# Patient Record
Sex: Female | Born: 1970
Health system: Southern US, Community
[De-identification: ages and names within clinical notes are randomized; demographics above are authoritative.]

## PROBLEM LIST (undated history)

## (undated) DIAGNOSIS — R7309 Other abnormal glucose: Secondary | ICD-10-CM

## (undated) DIAGNOSIS — E7800 Pure hypercholesterolemia, unspecified: Secondary | ICD-10-CM

## (undated) DIAGNOSIS — D649 Anemia, unspecified: Secondary | ICD-10-CM

## (undated) HISTORY — DX: Other abnormal glucose: R73.09

## (undated) HISTORY — PX: TONSILECTOMY, ADENOIDECTOMY, BILATERAL MYRINGOTOMY AND TUBES: SHX2538

## (undated) HISTORY — DX: Pure hypercholesterolemia, unspecified: E78.00

---

## 2015-04-12 ENCOUNTER — Encounter (HOSPITAL_COMMUNITY): Payer: Self-pay | Admitting: Emergency Medicine

## 2015-04-12 ENCOUNTER — Emergency Department (INDEPENDENT_AMBULATORY_CARE_PROVIDER_SITE_OTHER)
Admission: EM | Admit: 2015-04-12 | Discharge: 2015-04-12 | Disposition: A | Discharge status: Home / Self Care | Payer: Medicaid Other | Source: Home / Self Care | Attending: Emergency Medicine | Admitting: Emergency Medicine

## 2015-04-12 DIAGNOSIS — R600 Localized edema: Secondary | ICD-10-CM

## 2015-04-12 HISTORY — DX: Anemia, unspecified: D64.9

## 2015-04-12 MED ORDER — FUROSEMIDE 20 MG PO TABS: 20.0000 mg | ORAL_TABLET | Freq: Every day | ORAL | Status: DC

## 2015-04-12 NOTE — ED Notes (Signed)
Patient has been in [place] for 6 days.  Patient complains of back pain and bilateral leg swelling and pain.  Left lower leg more painful than right.  Patient reports history of the same for 4 months.

## 2015-04-12 NOTE — ED Notes (Signed)
Patient is being seen in the same treatment room as minor child.  Being seen by the same provider.  There is a language barrier, translater phones are currently in use, waiting for turn using  interpreter services.

## 2015-04-12 NOTE — ED Notes (Signed)
Patient care delay secondary to waiting for translater phone availability

## 2015-04-12 NOTE — Discharge Instructions (Signed)

## 2015-04-12 NOTE — ED Provider Notes (Signed)
CSN: [id number]     Arrival date & time 04/12/15  1332 History   First MD Initiated Contact with Patient 04/12/15 1458     Chief Complaint  Patient presents with  . Back Pain  . Leg Pain   (Consider location/radiation/quality/duration/timing/severity/associated sxs/prior Treatment) Patient is a 45 y.o. female presenting with back pain and leg pain. The history is provided by the patient. No language interpreter was used.  Back Pain Location:  Lumbar spine Quality:  Aching Radiates to:  Does not radiate Pain severity:  Moderate Pain is:  Same all the time Onset quality:  Gradual Duration:  1 week Timing:  Constant Progression:  Worsening Chronicity:  New Context: physical stress   Relieved by:  Nothing Worsened by:  Nothing tried Ineffective treatments:  None tried Associated symptoms: leg pain   Leg Pain Associated symptoms: back pain   Pt complains of pain in her low back from traveling.  Pt reports swelling in both lower lower legs.   Past Medical History  Diagnosis Date  . Anemia    Past Surgical History  Procedure Laterality Date  . Cesarean section     No family history on file. Social History  Substance Use Topics  . Smoking status: Never Smoker   . Smokeless tobacco: None  . Alcohol Use: No   OB History    No data available     Review of Systems  Musculoskeletal: Positive for back pain. Negative for joint swelling.  All other systems reviewed and are negative.   Allergies  Review of patient's allergies indicates no known allergies.  Home Medications   Prior to Admission medications   Medication Sig Start Date End Date Taking? Authorizing Provider  furosemide (LASIX) 20 MG tablet Take 1 tablet (20 mg total) by mouth daily. 04/12/15   Fransico Meadow, PA-C   Meds Ordered and Administered this Visit  Medications - No data to display  BP 119/78 mmHg  Pulse 78  Temp(Src) 97.5 F (36.4 C) (Oral)  SpO2 97% No data found.   Physical Exam   Constitutional: She is oriented to person, place, and time. She appears well-developed and well-nourished.  HENT:  Head: Normocephalic and atraumatic.  Eyes: Conjunctivae and EOM are normal. Pupils are equal, round, and reactive to light.  Neck: Normal range of motion.  Cardiovascular: Normal rate and normal heart sounds.   Pulmonary/Chest: Effort normal.  Abdominal: Soft. She exhibits no distension.  Musculoskeletal: She exhibits edema.  Neurological: She is alert and oriented to person, place, and time.  Psychiatric: She has a normal mood and affect.  Nursing note and vitals reviewed.   ED Course  Procedures (including critical care time)  Labs Review Labs Reviewed - No data to display  Imaging Review No results found.   Visual Acuity Review  Right Eye Distance:   Left Eye Distance:   Bilateral Distance:    Right Eye Near:   Left Eye Near:    Bilateral Near:         MDM  Pt ask about mammogram.   Pt given number for breast center   1. Bilateral edema of lower extremity    Meds ordered this encounter  Medications  . furosemide (LASIX) 20 MG tablet    Sig: Take 1 tablet (20 mg total) by mouth daily.    Dispense:  30 tablet    Refill:  2    Order Specific Question:  Supervising Provider    Answer:  Ihor Gully D [phone]  [place], PA-C 04/12/15 [phone]

## 2015-04-13 MED FILL — FUROSEMIDE 20 MG TABLET: 20 | 30 days supply | Qty: 30 | Fill #0

## 2015-06-28 ENCOUNTER — Other Ambulatory Visit: Payer: Self-pay

## 2015-07-11 ENCOUNTER — Other Ambulatory Visit: Payer: Self-pay

## 2015-07-11 ENCOUNTER — Other Ambulatory Visit: Payer: Self-pay | Admitting: Infectious Disease

## 2015-07-11 DIAGNOSIS — Z1231 Encounter for screening mammogram for malignant neoplasm of breast: Secondary | ICD-10-CM

## 2015-07-12 NOTE — Congregational Nurse Program (Signed)
Congregational Nurse Program Note  Date of Encounter: 07/11/2015  Past Medical History: Past Medical History  Diagnosis Date  . Anemia     Encounter Details:     CNP Questionnaire - 07/12/15 1426    Patient Demographics   Is this a new or existing patient? New   Patient is considered a/an Refugee   Race Asian   Patient Assistance   Location of Patient Assistance Not Applicable   Patient's financial/insurance status Medicaid   Uninsured Patient No   Patient referred to apply for the following financial assistance Not Applicable   Food insecurities addressed Not Applicable   Transportation assistance Yes   Type of Assistance Bus Pass Given   Assistance securing medications No   Educational health offerings Not Applicable   Encounter Details   Primary purpose of visit Education/Health Concerns   Was an Emergency Department visit averted? Not Applicable   Does patient have a medical provider? Yes   Patient referred to Follow up with established PCP;Area Agency   Was a mental health screening completed? (GAINS tool) No   Does patient have dental issues? No   Does patient have vision issues? No   Since previous encounter, have you referred patient for abnormal blood pressure that resulted in a new diagnosis or medication change? No   Since previous encounter, have you referred patient for abnormal blood glucose that resulted in a new diagnosis or medication change? No      Office visit at [employer] by this Arabic speaking, [place] born lady able to communicate with basic English conversation. Family including spouse and son recent arrivals attending English classes at [place].  Requesting assistance to schedule mammogram appointment. Referred to PCP by [facility] for evaluation of leg and back pain. History of "hiatal hernia and lumbar disc herniation" per GHD screening.  Also pointed out pain of left chest area beneath breast. Denies arm pain or SOB. Plan:  Appointments: [facility] Jul 26, 2015 at 2:30 pm.; [facility] July 13, 2015 at 1:30pm; [facility] for eye exam and  Eyeglasses. Jannetta Quint, RN/CN. [phone]

## 2015-07-26 ENCOUNTER — Ambulatory Visit
Admission: RE | Admit: 2015-07-26 | Discharge: 2015-07-26 | Disposition: A | Discharge status: Home / Self Care | Payer: Medicaid Other | Source: Ambulatory Visit | Attending: Infectious Disease | Admitting: Infectious Disease

## 2015-07-26 DIAGNOSIS — Z1231 Encounter for screening mammogram for malignant neoplasm of breast: Secondary | ICD-10-CM

## 2015-11-16 ENCOUNTER — Ambulatory Visit (INDEPENDENT_AMBULATORY_CARE_PROVIDER_SITE_OTHER): Payer: Medicaid Other | Admitting: Certified Nurse Midwife

## 2015-11-16 DIAGNOSIS — R399 Unspecified symptoms and signs involving the genitourinary system: Secondary | ICD-10-CM

## 2015-11-16 LAB — POCT URINALYSIS DIPSTICK
BILIRUBIN UA: NEGATIVE
Blood, UA: 250
Glucose, UA: NEGATIVE
KETONES UA: NEGATIVE
Leukocytes, UA: NEGATIVE
Nitrite, UA: NEGATIVE
PH UA: 5
PROTEIN UA: NEGATIVE
SPEC GRAV UA: 1.025
Urobilinogen, UA: NEGATIVE

## 2015-11-16 MED ORDER — NITROFURANTOIN MONOHYD MACRO 100 MG PO CAPS: 100.0000 mg | ORAL_CAPSULE | Freq: Two times a day (BID) | ORAL | 0 refills | Status: DC

## 2015-11-16 NOTE — Progress Notes (Signed)
Pt is in office today as a new patient/ problem.  Pt speaks Arabic- Language Resources was used in order to communicate with pt.  Pt complains of burning with urination and pain.  Pt states that she has seen blood in her urine.   Pt information was reviewed with RDenney, CNM. Pt made aware that her urine will be sent to lab for testing and Macrobid was sent to her pharmacy, per Denney,CNM.  Pt made aware that if anything changes once her results are back she will receive a phone call regarding labs.  Interpreter states that we may call her in order to communicate with pt via phone.   Interpreter number is [phone], name Bedor.  Pt was made aware that we will schedule her an appt for new pt/ annual so that we may have interpreter present for exam. Pt has been scheduled for 12/08/15 with R.Denney, CNM.    Pt was not seen by provider today, chart was reviewed for plan and orders.

## 2015-11-18 LAB — URINE CULTURE

## 2015-11-29 DIAGNOSIS — Z1384 Encounter for screening for dental disorders: Secondary | ICD-10-CM

## 2015-11-29 NOTE — Congregational Nurse Program (Signed)
Congregational Nurse Program Note  Date of Encounter: 11/29/2015  Past Medical History: Past Medical History:  Diagnosis Date  . Anemia     Encounter Details:     CNP Questionnaire - 11/29/15 1337      Patient Demographics   Is this a new or existing patient? Existing   Patient is considered a/an Refugee   Race Asian     Patient Assistance   Location of Patient Assistance Not Applicable   Patient's financial/insurance status Low Income;Medicaid;[place] Card/Care Connects   Uninsured Patient Yes   Interventions Not Applicable   Patient referred to apply for the following financial assistance Not Applicable   Food insecurities addressed Not Applicable   Transportation assistance No   Assistance securing medications No   Educational health offerings Navigating the healthcare system     Encounter Details   Primary purpose of visit Other   Was an Emergency Department visit averted? Not Applicable   Does patient have a medical provider? Yes   Patient referred to Other (comment)   Was a mental health screening completed? (GAINS tool) No   Does patient have dental issues? Yes   Was a dental referral made? Yes   Does patient have vision issues? No   Was a vision referral made? No   Does your patient have an abnormal blood pressure today? No   Since previous encounter, have you referred patient for abnormal blood pressure that resulted in a new diagnosis or medication change? No   Does your patient have an abnormal blood glucose today? No   Since previous encounter, have you referred patient for abnormal blood glucose that resulted in a new diagnosis or medication change? No   Was there a life-saving intervention made? No     Office visit for this lady speaking primarily Arabic, but communicated her need for assistance scheduling follow-up dental appointment for pain in tooth treated 5/17 with root canal. Tooth intact and enamel darkened.  Appointment scheduled with dental office  of Dr. Dayna Barker. [facility], 12/01/15 at 11:20 am. Appointment information provided to client. Jannetta Quint, RN/CN

## 2015-12-08 ENCOUNTER — Ambulatory Visit: Payer: Self-pay | Admitting: Obstetrics

## 2015-12-11 ENCOUNTER — Encounter: Payer: Self-pay | Admitting: Obstetrics & Gynecology

## 2015-12-11 ENCOUNTER — Ambulatory Visit (INDEPENDENT_AMBULATORY_CARE_PROVIDER_SITE_OTHER): Payer: Medicaid Other | Admitting: Obstetrics & Gynecology

## 2015-12-11 VITALS — BP 106/74 | HR 84 | Temp 97.8°F | Wt 167.0 lb

## 2015-12-11 DIAGNOSIS — R102 Pelvic and perineal pain: Secondary | ICD-10-CM | POA: Diagnosis not present

## 2015-12-11 MED ORDER — IBUPROFEN 600 MG PO TABS: 600.0000 mg | ORAL_TABLET | Freq: Four times a day (QID) | ORAL | 3 refills | Status: DC | PRN

## 2015-12-11 NOTE — Progress Notes (Signed)
History:  45 y.o. G3P0 here today for eval of pelvic pain.  Pt reports that her sx occur 2-3 times per months.  The pain is worse with walking up stairs.  She has taken a 'antispasmodic from [place]' to relieve the pain.  She reports monthly cycles that are heavy for 3 days and light for 3 days.  She reports that the pain gets worse with her cycles.   She was prev told in [place] that she needed a surgery to suspend her uterus.  She denies bulge in the vagina or pelvic pressure. She reports BM's every other day and denies pain worse prior to a BM.     She c/o vaginal itching.  She reports washing with a solution from [place] that she dilutes with water. She reports that this used externally only.  She denies douching.     The following portions of the patient's history were reviewed and updated as appropriate: allergies, current medications, past family history, past medical history, past social history, past surgical history and problem list.  Review of Systems:  Pertinent items are noted in HPI.  Objective:  Physical Exam Blood pressure 106/74, pulse 84, temperature 97.8 F (36.6 C), weight 167 lb (75.8 kg), last menstrual period 12/01/2015. Gen: NAD Abd: Soft, nontender and nondistended. Well healed lower abd transverse incision. Pelvic: Normal appearing external genitalia; normal appearing vaginal mucosa and cervix.  Normal discharge.  Small uterus, no other palpable masses, no uterine tenderness. Adnexal tenderness that is difficult to differentiate from the usual examination discomfort. No prolapse was noted of uterus or bladder.   Labs and Imaging No results found.  Assessment & Plan:  Pelvic pain female- unknown etiology.  Pt with h/o ov cyst  No prolapse was noted.  Discussed prolapse with pt.  Pelvic US- complete and TV Motrin 600mg  po with food prn pain  Rec Dove unscented soap for cleansing.    F/u in 3 months or sooner prn Sekou Zuckerman L. Harraway-Smith, M.D., Cherlynn June

## 2015-12-18 ENCOUNTER — Telehealth: Payer: Self-pay | Admitting: Obstetrics and Gynecology

## 2015-12-18 NOTE — Telephone Encounter (Signed)
Used [employer] [phone] to leave pt a message that we need to speak with her about appointments that we have scheduled with her.  Left our number [phone] for her to give [place] a call

## 2015-12-20 DIAGNOSIS — Z23 Encounter for immunization: Secondary | ICD-10-CM

## 2015-12-20 NOTE — Congregational Nurse Program (Signed)
Congregational Nurse Program Note  Date of Encounter: 12/20/2015  Past Medical History: Past Medical History:  Diagnosis Date  . Anemia   . Hypercholesteremia     Encounter Details:     CNP Questionnaire - 12/20/15 1100      Patient Demographics   Is this a new or existing patient? Existing   Patient is considered a/an Refugee   Race Asian     Patient Assistance   Location of Patient Assistance Not Applicable   Patient's financial/insurance status Low Income;Medicaid;[place] Card/Care Connects   Uninsured Patient Yes   Interventions Not Applicable   Patient referred to apply for the following financial assistance Not Applicable   Food insecurities addressed Not Applicable   Transportation assistance No   Assistance securing medications No   Educational health offerings Navigating the healthcare system     Encounter Details   Primary purpose of visit Other   Was an Emergency Department visit averted? Not Applicable   Does patient have a medical provider? Yes   Patient referred to Other (comment)   Was a mental health screening completed? (GAINS tool) No   Does patient have dental issues? Yes   Was a dental referral made? Yes   Does patient have vision issues? No   Was a vision referral made? No   Does your patient have an abnormal blood pressure today? No   Since previous encounter, have you referred patient for abnormal blood pressure that resulted in a new diagnosis or medication change? No   Does your patient have an abnormal blood glucose today? No   Since previous encounter, have you referred patient for abnormal blood glucose that resulted in a new diagnosis or medication change? No   Was there a life-saving intervention made? No     Office visit for this Arabic speaking lady requesting assistance in scheduling appointment for immunizations due at [facility] Department. Scheduled on 01/02/16 at 8:15 to update required vaccines. Agency information and  instructions provided to client regarding future scheduling and cancellation of appointments. Jannetta Quint, RN/CN

## 2015-12-22 ENCOUNTER — Ambulatory Visit (HOSPITAL_COMMUNITY)
Admission: RE | Admit: 2015-12-22 | Discharge: 2015-12-22 | Disposition: A | Discharge status: Home / Self Care | Payer: Medicaid Other | Source: Ambulatory Visit | Attending: Obstetrics & Gynecology | Admitting: Obstetrics & Gynecology

## 2015-12-22 DIAGNOSIS — R102 Pelvic and perineal pain: Secondary | ICD-10-CM | POA: Insufficient documentation

## 2015-12-22 DIAGNOSIS — R938 Abnormal findings on diagnostic imaging of other specified body structures: Secondary | ICD-10-CM | POA: Insufficient documentation

## 2015-12-27 ENCOUNTER — Telehealth: Payer: Self-pay | Admitting: *Deleted

## 2015-12-27 DIAGNOSIS — Z0120 Encounter for dental examination and cleaning without abnormal findings: Secondary | ICD-10-CM

## 2015-12-27 NOTE — Congregational Nurse Program (Signed)
Congregational Nurse Program Note  Date of Encounter: 12/27/2015  Past Medical History: Past Medical History:  Diagnosis Date  . Anemia   . Hypercholesteremia     Encounter Details:     CNP Questionnaire - 12/27/15 1100      Patient Demographics   Is this a new or existing patient? Existing   Patient is considered a/an Refugee   Race Asian     Patient Assistance   Location of Patient Assistance Not Applicable   Patient's financial/insurance status Low Income;Medicaid;[place] Card/Care Connects   Uninsured Patient Yes   Interventions Not Applicable   Patient referred to apply for the following financial assistance Not Applicable   Food insecurities addressed Not Applicable   Transportation assistance No   Assistance securing medications No   Educational health offerings Navigating the healthcare system     Encounter Details   Primary purpose of visit Other   Was an Emergency Department visit averted? Not Applicable   Does patient have a medical provider? Yes   Patient referred to Other (comment)   Was a mental health screening completed? (GAINS tool) No   Does patient have dental issues? Yes   Was a dental referral made? Yes   Does patient have vision issues? No   Was a vision referral made? No   Does your patient have an abnormal blood pressure today? No   Since previous encounter, have you referred patient for abnormal blood pressure that resulted in a new diagnosis or medication change? No   Does your patient have an abnormal blood glucose today? No   Since previous encounter, have you referred patient for abnormal blood glucose that resulted in a new diagnosis or medication change? No   Was there a life-saving intervention made? No                                                                                                             Office visit at [place] to request assistance in scheduling an           appointment to see dental surgeon after evaluation of dental  pain by general dentist. Arabic speaking lady speaking limited English through interview. Appointment scheduled for January 23, 2016 at 1:30; Dr. Mart Piggs , [address] [place]. Jannetta Quint, RN/CN.

## 2015-12-27 NOTE — Telephone Encounter (Signed)
Call to patient - tried to convey results of test- but patient does not understand [place]. She has an appointment 10/9 and she will keep that to review her results.

## 2015-12-27 NOTE — Telephone Encounter (Signed)
-----   Message from Tamara Drafts, Tamara Proctor sent at 12/25/2015  8:49 AM EDT ----- Please call pt. Her sono does not show any ov cysts.  It does show 2 VERY small fibroids that are probably NOT the cause of pts sx.  No immediate f/u needed.  F/u for annual as needed.  Thx, clh-S

## 2016-01-01 ENCOUNTER — Ambulatory Visit (INDEPENDENT_AMBULATORY_CARE_PROVIDER_SITE_OTHER): Payer: Medicaid Other | Admitting: Obstetrics and Gynecology

## 2016-01-01 DIAGNOSIS — D259 Leiomyoma of uterus, unspecified: Secondary | ICD-10-CM | POA: Diagnosis not present

## 2016-01-01 DIAGNOSIS — Z712 Person consulting for explanation of examination or test findings: Secondary | ICD-10-CM

## 2016-01-01 NOTE — Progress Notes (Signed)
   Subjective:    Patient ID: Tamara Proctor, female    DOB: 24-Apr-1970, 45 y.o.   MRN: [id number]  HPI 45 yo presenting today to discuss ultrasound results. Patient reports feeling well since her September visit. Se denies any occurrence of pelvic pain. She states her period came a week early but was over normal flow and length  Past Medical History:  Diagnosis Date  . Anemia   . Hypercholesteremia    Past Surgical History:  Procedure Laterality Date  . CESAREAN SECTION    . TONSILECTOMY, ADENOIDECTOMY, BILATERAL MYRINGOTOMY AND TUBES     Family History  Problem Relation Age of Onset  . Hypertension Mother   . Diabetes Mother   . Breast cancer Mother   . Hypertension Father   . Diabetes Father    Social History  Substance Use Topics  . Smoking status: Never Smoker  . Smokeless tobacco: Never Used  . Alcohol use No      Review of Systems See pertinent in HPI    Objective:   Physical Exam Last menstrual period 12/01/2015.  GENERAL: Well-developed, well-nourished female in no acute distress.  Neuro: Alert and oriented x 3  FINDINGS: Uterus  Measurements: 9.1 x 5.5 x 6.6 cm. There is a 1.6 x 1.3 x 1.4 cm submucosal fibroid within the posterior uterine body. There is an additional 1.1 x 0.9 x 1.0 cm submucosal fibroid of the anterior uterine body.  Endometrium  Thickness: 13 mm.  No focal abnormality visualized.  Right ovary  Measurements: 2.8 x 1.6 x 1.6 cm. Normal appearance/no adnexal mass.  Left ovary  Measurements: 3.6 x 1.9 x 2.8 cm. Normal appearance/no adnexal mass.  Other findings  Trace pelvic free fluid.  IMPRESSION: Suspect two submucosal fibroids within the mid aspect of the uterine body, anteriorly and posteriorly.  Normal appearing ovaries.   Electronically Signed   By: Lovey Newcomer M.D.   On: 12/22/2015 11:14      Assessment & Plan:  45 yo here for results - Ultrasound results reviewed with the patient -  Discussed that etiology of pain is unlikely of GYN origin and to follow up with PCP if pain returns - Informed patient that irregular period or skipping periods is normal in the perimenopausal phase - patient verbalized understanding and all questions were answered - RTC for annual exam or prn

## 2016-01-01 NOTE — Progress Notes (Signed)
Patient is in office to follow up on results.

## 2016-02-13 NOTE — Congregational Nurse Program (Signed)
Congregational Nurse Program Note  Date of Encounter: 02/13/2016  Past Medical History: Past Medical History:  Diagnosis Date  . Anemia   . Hypercholesteremia     Encounter Details:     CNP Questionnaire - 02/13/16 1443      Patient Demographics   Is this a new or existing patient? Existing   Patient is considered a/an Refugee   Race Asian     Patient Assistance   Location of Patient Assistance Not Applicable   Patient's financial/insurance status Medicaid   Uninsured Patient ([facility] Oncologist) No   Patient referred to apply for the following financial assistance Not Applicable   Food insecurities addressed Not Applicable   Transportation assistance No   Assistance securing medications No   Educational Programmer, multimedia the healthcare system     Encounter Details   Primary purpose of visit Acute Illness/Condition Visit   Was an Emergency Department visit averted? Not Applicable   Does patient have a medical provider? Yes   Patient referred to Other   Was a mental health screening completed? (GAINS tool) No   Does patient have dental issues? Yes   Was a dental referral made? Yes   Does patient have vision issues? No   Does your patient have an abnormal blood pressure today? No   Since previous encounter, have you referred patient for abnormal blood pressure that resulted in a new diagnosis or medication change? No   Does your patient have an abnormal blood glucose today? No   Since previous encounter, have you referred patient for abnormal blood glucose that resulted in a new diagnosis or medication change? No   Was there a life-saving intervention made? No    Office visit for client to request information and assistance in changing PCP and completion of pre-registration information for [facility] FP clinic for self and spouse. Also requesting assistance in referral for completion of immunization information for Green Card application and referral to dentist  for cavity and broken tooth. Plan: Instructed in completion of family's information for PCP appointment at [facility]. Appointment for [facility] due after anniversary date for entry in [place].; call after 01/18. Dental appointment with  Dr. Burnard Bunting on. 02/21/16 at 3:10 pm. Jannetta Quint, RN/CN

## 2016-03-01 ENCOUNTER — Encounter (HOSPITAL_COMMUNITY): Payer: Self-pay | Admitting: Emergency Medicine

## 2016-03-01 ENCOUNTER — Emergency Department (HOSPITAL_COMMUNITY)
Admission: EM | Admit: 2016-03-01 | Discharge: 2016-03-01 | Disposition: A | Discharge status: Home / Self Care | Payer: Medicaid Other | Attending: Emergency Medicine | Admitting: Emergency Medicine

## 2016-03-01 DIAGNOSIS — M79632 Pain in left forearm: Secondary | ICD-10-CM | POA: Diagnosis not present

## 2016-03-01 DIAGNOSIS — M79602 Pain in left arm: Secondary | ICD-10-CM

## 2016-03-01 DIAGNOSIS — M25522 Pain in left elbow: Secondary | ICD-10-CM | POA: Diagnosis present

## 2016-03-01 MED ORDER — IBUPROFEN 400 MG PO TABS: 600.0000 mg | ORAL_TABLET | Freq: Four times a day (QID) | ORAL | 0 refills | Status: DC | PRN

## 2016-03-01 NOTE — Discharge Instructions (Signed)
Please take anti-inflammatory medication (Advil, 400 mg) every 6 hours for the first 5 days.  You may decrease this to once a day if your pain improves after 5 days.   Please wear the splint and sling to immobilize your wrist and elbow.  You may take off the sling to sleep, but please keep the splint on at night as well.   Return to the ED if your symptoms worsen or you experience numbness of loss of function of your left hand.   If you do not have a primary care doctor, you can call [facility] [phone]) to make an appointment and establish care with a doctor there

## 2016-03-01 NOTE — ED Notes (Signed)
Paged to apply long forearm wrist splint with sling

## 2016-03-01 NOTE — ED Notes (Signed)
EDP at bedside  

## 2016-03-01 NOTE — ED Notes (Signed)
Khaled [id number] Interpretor Pt. From [place] and speaks arabic.  Left arm started hurting today and had an episode similar two days ago. Pt. Hx of sciatica for left leg. Pt. Denies any chest pain. Pt. Hx of herniated diaphragm and elevated cholesterol.

## 2016-03-01 NOTE — Progress Notes (Signed)
Orthopedic Tech Progress Note Patient Details:  Tamara Proctor March 10, 1971 [id number]  Ortho Devices Type of Ortho Device: Velcro wrist forearm splint Ortho Device/Splint Interventions: Application   [place] Pink 03/01/2016, 4:56 PM

## 2016-03-01 NOTE — ED Triage Notes (Signed)
Pt. Lt. Arm pain the pain begins by the elbow and radiates into her hand.   She denies any chest pain , sob , an/v/d  Movement increases the pain.  She is also having pain lt. Calf area , denies any injuries the pain radiates into the foot.   The pain in her calf started a while ago, but today the pain has increased.  Skin is warm and dry and pink.  She denies any cold symptoms..  Used the [employer] for Interrupter.

## 2016-03-01 NOTE — ED Provider Notes (Signed)
[facility] DEPT Provider Note   CSN: [id number] Arrival date & time: 03/01/16  1224     History   Chief Complaint Chief Complaint  Patient presents with  . Arm Pain    HPI Tamara Proctor is a 45 y.o. left handed female with pmh as noted below presents with L elbow pain, forearm pain and wrist pain a/w finger tingling x 2 days.  Pt says tingling is worst in L middle and ring finger.  Pt denies trauma or injury to R arm.  Pt is not currently working, does not play tennis or any other sports that require repetitive use of her L hand.  Pt has not tried any pain medications.    HPI  Past Medical History:  Diagnosis Date  . Anemia   . Hypercholesteremia     There are no active problems to display for this patient.   Past Surgical History:  Procedure Laterality Date  . CESAREAN SECTION    . TONSILECTOMY, ADENOIDECTOMY, BILATERAL MYRINGOTOMY AND TUBES      OB History    Gravida Para Term Preterm AB Living   3         3   SAB TAB Ectopic Multiple Live Births                   Home Medications    Prior to Admission medications   Medication Sig Start Date End Date Taking? Authorizing Provider  ibuprofen (ADVIL,MOTRIN) 400 MG tablet Take 1.5 tablets (600 mg total) by mouth every 6 (six) hours as needed. 03/01/16   Kinnie Feil, PA-C  ibuprofen (ADVIL,MOTRIN) 600 MG tablet Take 1 tablet (600 mg total) by mouth every 6 (six) hours as needed. Patient not taking: Reported on 01/01/2016 12/11/15   Lavonia Drafts, MD    Family History Family History  Problem Relation Age of Onset  . Hypertension Mother   . Diabetes Mother   . Breast cancer Mother   . Hypertension Father   . Diabetes Father     Social History Social History  Substance Use Topics  . Smoking status: Never Smoker  . Smokeless tobacco: Never Used  . Alcohol use No     Allergies   Patient has no known allergies.   Review of Systems Review of Systems  Constitutional: Negative  for fever.  Respiratory: Negative for cough and shortness of breath.   Cardiovascular: Negative for chest pain and leg swelling.  Gastrointestinal: Negative for abdominal pain, constipation, diarrhea, nausea and vomiting.  Genitourinary: Negative for difficulty urinating.  Musculoskeletal: Negative for back pain.  Skin: Negative for rash.  Neurological: Negative for headaches.  Hematological: Negative.   Psychiatric/Behavioral: Negative.      Physical Exam Updated Vital Signs BP 105/67   Pulse 65   Resp 16   Ht 5\' 2"  (1.575 m)   Wt 75.8 kg   LMP 02/12/2016   SpO2 100%   BMI 30.54 kg/m   Physical Exam  Constitutional: She is oriented to person, place, and time. She appears well-developed and well-nourished.  Pt found seated on bed accompanied by family member.  I used spectrum translator [email or web address] [id number] to communicate with patient.   HENT:  Head: Normocephalic and atraumatic.  Mouth/Throat: Oropharynx is clear and moist. No oropharyngeal exudate.  Eyes: Conjunctivae are normal. Pupils are equal, round, and reactive to light.  Neck: Neck supple.  Cardiovascular: Normal rate, regular rhythm and intact distal pulses.   No murmur heard. Radial pulses intact bilaterally.  Pulmonary/Chest: Effort normal and breath sounds normal.  Musculoskeletal: Normal range of motion. She exhibits no deformity.  Tenderness over medial elbow and medial forearm with deep pressure. Full ROM of wrists, fingers and elbows bilaterally. No erythema, edema, ecchymosis or signs of trauma over R elbow, forearm or wrist.    Lymphadenopathy:    She has no cervical adenopathy.  Neurological: She is alert and oriented to person, place, and time.  Positive Tinnel's and Phalen's on R wrist. Sensation to light touch intact in upper extremities.  Muscle tone and bulk normal of R forearm and hand.  Strength 5/5 in bilateral biceps, triceps, wrist extension/flexion, thumb opposition and finger abduction bilaterally.   5/5 grip strength bilaterally.    Skin: Skin is warm and dry. Capillary refill takes less than 2 seconds.  Psychiatric: She has a normal mood and affect.  Nursing note and vitals reviewed.    ED Treatments / Results  Labs (all labs ordered are listed, but only abnormal results are displayed) Labs Reviewed - No data to display  EKG  EKG Interpretation None       Radiology No results found.  Procedures Procedures (including critical care time)  Medications Ordered in ED Medications - No data to display   Initial Impression / Assessment and Plan / ED Course  I have reviewed the triage vital signs and the nursing notes.  Pertinent labs & imaging results that were available during my care of the patient were reviewed by me and considered in my medical decision making (see chart for details).  Clinical Course    45 yo female with pmh as noted above presents with pain at L medial elbow that radiates to forearm and wrist associated with L middle and ring finger tingling that started 2 days ago.  No injury.  Pt is left handed.  On exam pt has tenderness over L medial elbow and forearm.  Positive Tinnel's and Phalen's of R wrist.  Strength and sensation to light touch intact in affected L extremity.  L radial pulse intact. No midline cervical pain tenderness or pain over R shoulder.  Doubt fracture or dislocation.  Pt presentation and exam most consistent with carpal tunnel syndrome.  No further imaging or lab work up indicated at this time.  Pt stable for discharge with long L wrist splint and sling for additional support and ibuprofen, rest, ice, and close follow up with PCP.  Pt states she has a PCP, although one was not listed on pt's chart.  Pt given information for [facility].  Pt agreeable to plan, verbalized understanding.   Use of translator ([email or web address] [id number]) for patient communication.    Final Clinical Impressions(s) / ED Diagnoses   Final  diagnoses:  Left arm pain    New Prescriptions Discharge Medication List as of 03/01/2016  4:43 PM    START taking these medications   Details  !! ibuprofen (ADVIL,MOTRIN) 400 MG tablet Take 1.5 tablets (600 mg total) by mouth every 6 (six) hours as needed., Starting Fri 03/01/2016, Print     !! - Potential duplicate medications found. Please discuss with provider.       Kinnie Feil, PA-C 03/01/16 [place], MD 03/02/16 [place], MD 03/02/16 1256

## 2016-04-05 ENCOUNTER — Telehealth: Payer: Self-pay | Admitting: General Practice

## 2016-04-05 ENCOUNTER — Encounter: Payer: Self-pay | Admitting: Family Medicine

## 2016-04-05 ENCOUNTER — Other Ambulatory Visit: Payer: Self-pay | Admitting: Family Medicine

## 2016-04-05 ENCOUNTER — Ambulatory Visit (INDEPENDENT_AMBULATORY_CARE_PROVIDER_SITE_OTHER): Payer: BLUE CROSS/BLUE SHIELD | Admitting: Family Medicine

## 2016-04-05 VITALS — BP 110/70 | HR 78 | Temp 98.4°F | Ht 62.0 in | Wt 174.0 lb

## 2016-04-05 DIAGNOSIS — E785 Hyperlipidemia, unspecified: Secondary | ICD-10-CM | POA: Diagnosis not present

## 2016-04-05 DIAGNOSIS — M5432 Sciatica, left side: Secondary | ICD-10-CM | POA: Insufficient documentation

## 2016-04-05 DIAGNOSIS — D509 Iron deficiency anemia, unspecified: Secondary | ICD-10-CM

## 2016-04-05 DIAGNOSIS — M545 Low back pain: Secondary | ICD-10-CM

## 2016-04-05 LAB — COMPLETE METABOLIC PANEL WITH GFR
ALT: 9 U/L (ref 6–29)
AST: 15 U/L (ref 10–35)
Albumin: 4 g/dL (ref 3.6–5.1)
Alkaline Phosphatase: 53 U/L (ref 33–115)
BILIRUBIN TOTAL: 0.2 mg/dL (ref 0.2–1.2)
BUN: 13 mg/dL (ref 7–25)
CALCIUM: 9.1 mg/dL (ref 8.6–10.2)
CO2: 27 mmol/L (ref 20–31)
CREATININE: 0.79 mg/dL (ref 0.50–1.10)
Chloride: 105 mmol/L (ref 98–110)
GFR, Est Non African American: 89 mL/min (ref >=60)
Glucose, Bld: 72 mg/dL (ref 65–99)
Potassium: 4.2 mmol/L (ref 3.5–5.3)
Sodium: 138 mmol/L (ref 135–146)
Total Protein: 6.8 g/dL (ref 6.1–8.1)

## 2016-04-05 LAB — CBC WITH DIFFERENTIAL/PLATELET
BASOS PCT: 0 %
Basophils Absolute: 0 cells/uL (ref 0–200)
Eosinophils Absolute: 62 cells/uL (ref 15–500)
Eosinophils Relative: 1 %
HCT: 36.7 % (ref 35.0–45.0)
Hemoglobin: 11.9 g/dL (ref 11.7–15.5)
LYMPHS PCT: 28 %
Lymphs Abs: 1736 cells/uL (ref 850–3900)
MCH: 28.1 pg (ref 27.0–33.0)
MCHC: 32.4 g/dL (ref 32.0–36.0)
MCV: 86.6 fL (ref 80.0–100.0)
MONO ABS: 558 {cells}/uL (ref 200–950)
MONOS PCT: 9 %
MPV: 10.9 fL (ref 7.5–12.5)
Neutro Abs: 3844 cells/uL (ref 1500–7800)
Neutrophils Relative %: 62 %
PLATELETS: 245 10*3/uL (ref 140–400)
RBC: 4.24 MIL/uL (ref 3.80–5.10)
RDW: 13.5 % (ref 11.0–15.0)
WBC: 6.2 10*3/uL (ref 3.8–10.8)

## 2016-04-05 LAB — LIPID PANEL
CHOL/HDL RATIO: 4.9 ratio (ref <5.0)
Cholesterol: 221 mg/dL — ABNORMAL HIGH (ref <200)
HDL: 45 mg/dL — AB (ref >50)
LDL CALC: 138 mg/dL — AB (ref <100)
TRIGLYCERIDES: 188 mg/dL — AB (ref <150)
VLDL: 38 mg/dL — ABNORMAL HIGH (ref <30)

## 2016-04-05 LAB — TSH: TSH: 1.97 mIU/L

## 2016-04-05 MED ORDER — GABAPENTIN 100 MG PO CAPS: 100.0000 mg | ORAL_CAPSULE | Freq: Three times a day (TID) | ORAL | 3 refills | Status: DC

## 2016-04-05 NOTE — Assessment & Plan Note (Addendum)
  Has been told she has high cholesterol in past but was not put on any medication for this. Has eaten today  -lipid panel and CMP today -follow up on results at next appt

## 2016-04-05 NOTE — Assessment & Plan Note (Signed)
  Has been told she is anemic in past, possibly iron deficient but is unsure  -CBC w/ diff today -consider adding iron panel on in future if anemic -follow up in 2-3 weeks to discuss results

## 2016-04-05 NOTE — Telephone Encounter (Signed)
Pt changed her mind about where she wanted to get her MRI done.  She wants to go to [facility].  The appt has been cancelled and another appt made at [facility]. A new drs order is needed.  Please leave the original order in and let [facility] remove it after they transfer the answers to questions.

## 2016-04-05 NOTE — Addendum Note (Signed)
Addended by: Lucila Maine C on: 04/05/2016 04:15 PM   Modules accepted: Orders

## 2016-04-05 NOTE — Progress Notes (Signed)
    Subjective:    Patient ID: Tamara Proctor, female    DOB: March 31, 1970, 46 y.o.   MRN: [id number]   CC: new patient here to establish care, left sciatic pain, hand swelling  Left sciatic pain- has been going on for several years. Was seen in [place] and had imaging, was told she had an issue with a disc in her spine. She cannot recall what was wrong but maybe a slipped or bulging disc. Nothing was done. The pain occurs constantly and is worse with walking. She is frustrated by it. Was told to take ibuprofen for this but she had reflux and cannot tolerate NSAIDs well. No weakness, no bowel/bladder incontinence or retention. No loss of sensations.   Hand swelling- this has been an ongoing issue as well. Has been seen for this in past over past several years but never diagnosed with anything. Was told it was fluid overload and given Lasix to take. Has not taken lasix for this episode. Has been going on past few days. Feels they are painful at night occasionally. Denies other joint pain. No fevers or chills.   PMH- high cholesterol, sciatic pain on left, anemia, back pain, GERD, RA? Meds- omeprazole as needed, vitamin B12 Allg- seasonal SurgHx- c-section 72 FH- mom with DM, CAD, HTN. Dad with blood clot, CAD, HTN. Brother had stroke SH- married with 2 children, recently moved here from [place]. Stay at home mom.  Smoking status reviewed- non-smoker  Review of Systems- see HPI   Objective:  BP 110/70   Pulse 78   Temp 98.4 F (36.9 C) (Oral)   Ht 5\' 2"  (1.575 m)   Wt 174 lb (78.9 kg)   LMP 03/09/2016   BMI 31.83 kg/m  Vitals and nursing note reviewed  General: well nourished, in no acute distress Neck: supple, non-tender, without lymphadenopathy Cardiac: RRR, clear S1 and S2, no murmurs, rubs, or gallops Respiratory: clear to auscultation bilaterally, no increased work of breathing Abdomen: soft, nontender, nondistended, no masses or organomegaly. Bowel sounds  present Extremities: no edema or cyanosis. Warm, well perfused. 2+ radial pulses bilaterally Skin: warm and dry, no rashes noted, capillary refill <3 seconds Neuro: alert and oriented, no focal deficits. Gait normal. 5/5 strength in upper and lower extremities bilaterally. Sensation in tact throughout.    Assessment & Plan:    Sciatica of left side  Has been on and off problems for years. Has been told in past in [place] she has a disc slipped or bulging- she is unsure  -gabapentin 100mg  TID, titrate up to 200 then 300 TID as tolerated -lumbar MRI w/o contrast -will discuss further at follow up visit  Iron deficiency anemia  Has been told she is anemic in past, possibly iron deficient but is unsure  -CBC w/ diff today -consider adding iron panel on in future if anemic -follow up in 2-3 weeks to discuss results  Hyperlipidemia  Has been told she has high cholesterol in past but was not put on any medication for this. Has eaten today  -lipid panel and CMP today -follow up on results at next appt    Return in about 3 weeks (around 04/26/2016).   Lucila Maine, DO Family Medicine Resident PGY-1

## 2016-04-05 NOTE — Assessment & Plan Note (Signed)
  Has been on and off problems for years. Has been told in past in [place] she has a disc slipped or bulging- she is unsure  -gabapentin 100mg  TID, titrate up to 200 then 300 TID as tolerated -lumbar MRI w/o contrast -will discuss further at follow up visit

## 2016-04-05 NOTE — Telephone Encounter (Signed)
Will forward to Dr. Vanetta Shawl to place a new order for this MRI.  Jazmin Hartsell,CMA

## 2016-04-12 ENCOUNTER — Ambulatory Visit (HOSPITAL_COMMUNITY): Payer: BLUE CROSS/BLUE SHIELD

## 2016-04-12 ENCOUNTER — Encounter (HOSPITAL_COMMUNITY): Payer: Self-pay

## 2016-04-13 ENCOUNTER — Other Ambulatory Visit: Payer: BLUE CROSS/BLUE SHIELD

## 2016-04-15 ENCOUNTER — Ambulatory Visit
Admission: RE | Admit: 2016-04-15 | Discharge: 2016-04-15 | Disposition: A | Discharge status: Home / Self Care | Payer: BLUE CROSS/BLUE SHIELD | Source: Ambulatory Visit | Attending: Family Medicine | Admitting: Family Medicine

## 2016-04-15 ENCOUNTER — Other Ambulatory Visit: Payer: BLUE CROSS/BLUE SHIELD

## 2016-04-15 DIAGNOSIS — M5432 Sciatica, left side: Secondary | ICD-10-CM

## 2016-04-22 ENCOUNTER — Ambulatory Visit (INDEPENDENT_AMBULATORY_CARE_PROVIDER_SITE_OTHER): Payer: BLUE CROSS/BLUE SHIELD | Admitting: Family Medicine

## 2016-04-22 VITALS — BP 112/72 | HR 73 | Temp 98.5°F | Ht 62.0 in | Wt 173.0 lb

## 2016-04-22 DIAGNOSIS — E785 Hyperlipidemia, unspecified: Secondary | ICD-10-CM

## 2016-04-22 DIAGNOSIS — M5432 Sciatica, left side: Secondary | ICD-10-CM

## 2016-04-22 MED ORDER — GABAPENTIN 100 MG PO CAPS: 200.0000 mg | ORAL_CAPSULE | Freq: Three times a day (TID) | ORAL | 3 refills | Status: DC

## 2016-04-22 NOTE — Patient Instructions (Addendum)
  Please do the stretches I gave you for lower back pain.  Slowly increase Gabapentin to 3 pills 3 times a day.

## 2016-04-22 NOTE — Assessment & Plan Note (Addendum)
  Current ASCVD score 1.1%.  -encouraged lifestyle changes -will repeat lipid panel in 1 year

## 2016-04-22 NOTE — Assessment & Plan Note (Addendum)
  MRI shows disc bulging at L4-L5 with mild nerve root compression. Patient not interested in injections at this time  -Increase gabapentin to 200 mg TID, RX sent to pharmacy -can increase this further to 300 mg TID if drowsiness does not limit doing so -Patient given home stretches to do for low back pain -Follow up as needed

## 2016-04-22 NOTE — Progress Notes (Signed)
   Subjective:    Patient ID: Tamara Proctor, female    DOB: [birth year], 46 y.o.   MRN: [id number]   CC: back pain follow up on MRI results  Back pain- gave gabapentin at last visit- has been taking 100 mg three times a day and it helps. She does have drowsiness with this medicine. She reports having numbness in her left leg and she stumbled trying to walk on it last month. She will have numbness when she kneels to pray. She also has pain in the leg especially at night. She has had issues with bladder incontinence for over a year. She leaks urine with sneezing or coughing. She adamantly does not want any surgical intervention or injections in her back.   She also reports some left shoulder pain. She cannot tolerate ibuprofen, it hurts her stomach.  Smoking status reviewed- never smoker  Review of Systems- see HPI   Objective:  BP 112/72 (BP Location: Right Arm, Patient Position: Sitting, Cuff Size: Normal)   Pulse 73   Temp 98.5 F (36.9 C) (Oral)   Ht 5\' 2"  (1.575 m)   Wt 173 lb (78.5 kg)   SpO2 99%   BMI 31.64 kg/m  Vitals and nursing note reviewed  General: well nourished, in no acute distress HEENT: normocephalic, TM's visualized bilaterally, no scleral icterus or conjunctival pallor, no nasal discharge, moist mucous membranes, good dentition without erythema or discharge noted in posterior oropharynx Neck: supple, non-tender, without lymphadenopathy Cardiac: RRR, clear S1 and S2, no murmurs, rubs, or gallops Respiratory: clear to auscultation bilaterally, no increased work of breathing Abdomen: soft, nontender, nondistended, no masses or organomegaly. Bowel sounds present Extremities: no edema or cyanosis. Warm, well perfused. 2+ radial and PT pulses bilaterally Skin: warm and dry, no rashes noted Neuro: alert and oriented, no focal deficits   Assessment & Plan:    Hyperlipidemia  Current ASCVD score 1.1%.  -encouraged lifestyle changes -will repeat lipid  panel in 1 year  Sciatica of left side  MRI shows disc bulging at L4-L5 with mild nerve root compression. Patient not interested in injections at this time  -Increase gabapentin to 200 mg TID, RX sent to pharmacy -can increase this further to 300 mg TID if drowsiness does not limit doing so -Patient given home stretches to do for low back pain -Follow up as needed    Return in about 6 months (around 10/20/2016).   Lucila Maine, DO Family Medicine Resident PGY-1

## 2016-05-16 ENCOUNTER — Other Ambulatory Visit: Payer: Self-pay | Admitting: Family Medicine

## 2016-05-16 MED ORDER — AZITHROMYCIN 250 MG PO TABS: ORAL_TABLET | ORAL | 0 refills | Status: AC

## 2016-05-18 ENCOUNTER — Emergency Department (HOSPITAL_COMMUNITY)
Admission: EM | Admit: 2016-05-18 | Discharge: 2016-05-18 | Disposition: A | Discharge status: Home / Self Care | Payer: BLUE CROSS/BLUE SHIELD | Attending: Emergency Medicine | Admitting: Emergency Medicine

## 2016-05-18 ENCOUNTER — Encounter (HOSPITAL_COMMUNITY): Payer: Self-pay | Admitting: Emergency Medicine

## 2016-05-18 ENCOUNTER — Emergency Department (HOSPITAL_COMMUNITY): Payer: BLUE CROSS/BLUE SHIELD

## 2016-05-18 DIAGNOSIS — Z79899 Other long term (current) drug therapy: Secondary | ICD-10-CM | POA: Insufficient documentation

## 2016-05-18 DIAGNOSIS — R05 Cough: Secondary | ICD-10-CM | POA: Diagnosis present

## 2016-05-18 DIAGNOSIS — R059 Cough, unspecified: Secondary | ICD-10-CM

## 2016-05-18 MED ORDER — ALBUTEROL SULFATE HFA 108 (90 BASE) MCG/ACT IN AERS: 1.0000 | INHALATION_SPRAY | Freq: Four times a day (QID) | RESPIRATORY_TRACT | 0 refills | Status: DC | PRN

## 2016-05-18 MED ORDER — ACETAMINOPHEN 325 MG PO TABS: 650.0000 mg | ORAL_TABLET | Freq: Four times a day (QID) | ORAL | 0 refills | Status: AC | PRN

## 2016-05-18 MED ORDER — DEXAMETHASONE 4 MG PO TABS
8.0000 mg | ORAL_TABLET | Freq: Once | ORAL | Status: AC
  Administered 2016-05-18: 8 mg via ORAL
  Filled 2016-05-18: qty 2

## 2016-05-18 MED ORDER — ALBUTEROL SULFATE (2.5 MG/3ML) 0.083% IN NEBU
5.0000 mg | INHALATION_SOLUTION | Freq: Once | RESPIRATORY_TRACT | Status: AC
  Administered 2016-05-18: 5 mg via RESPIRATORY_TRACT
  Filled 2016-05-18: qty 6

## 2016-05-18 NOTE — ED Notes (Signed)
Pt stable, ambulatory, states understanding of discharge instructions 

## 2016-05-18 NOTE — ED Notes (Signed)
Triage done with interpreter phone:  Pt presents to ED for assessment of cough and congestion since Monday.  Pt sts hx of asthma with difficulty with colds.  PAtient seen at PCP and given a z-pack which patient has taken 2 pills from so far.  NAD.

## 2016-05-18 NOTE — ED Provider Notes (Signed)
[facility] DEPT Provider Note   CSN: [id number] Arrival date & time: 05/18/16  [id number]  By signing my name below, I, Dolores Hoose, attest that this documentation has been prepared under the direction and in the presence of Ripley Fraise, MD . Electronically Signed: Dolores Hoose, Scribe. 05/18/2016. 1:51 AM.  History   Chief Complaint Chief Complaint  Patient presents with  . Cough  . Asthma   The history is provided by the patient. The history is limited by a language barrier. A language interpreter was used.  Cough  The current episode started more than 2 days ago. The problem occurs constantly. The problem has been gradually worsening. The cough is productive of sputum. Maximum temperature: Subjective. The fever has been present for 3 to 4 days. Associated symptoms include headaches and sore throat. Treatments tried: Abx. The treatment provided no relief. She is not a smoker.   Interpreter Number: [id number] (assyrian arabic)  Tamara Proctor is a 46 y.o. female with pmhx of asthma who presents to the Emergency Department complaining of frequent, worsening cough onset 3 days. Pt reports associated fever, headache, and sore throat. She states she has experienced similar symptoms in the past and was given inhaled steroids, which gave her some relief. Pt also notes she was seen previously at [facility] and given azithromycin with no relief. She denies any hemoptysis, vomiting or diarrhea. Pt also denies any recent hx of long travel.   Past Medical History:  Diagnosis Date  . Anemia   . Hypercholesteremia     Patient Active Problem List   Diagnosis Date Noted  . Sciatica of left side 04/05/2016  . Iron deficiency anemia 04/05/2016  . Hyperlipidemia 04/05/2016    Past Surgical History:  Procedure Laterality Date  . CESAREAN SECTION    . TONSILECTOMY, ADENOIDECTOMY, BILATERAL MYRINGOTOMY AND TUBES      OB History    Gravida Para Term Preterm AB Living   3         3   SAB TAB  Ectopic Multiple Live Births                   Home Medications    Prior to Admission medications   Medication Sig Start Date End Date Taking? Authorizing Provider  azithromycin (ZITHROMAX) 250 MG tablet Take 2 tabs by mouth on day 1, then 1 tab by mouth daily 05/16/16 05/22/16  Steve Rattler, DO  gabapentin (NEURONTIN) 100 MG capsule Take 2 capsules (200 mg total) by mouth 3 (three) times daily. 04/22/16   Steve Rattler, DO  ibuprofen (ADVIL,MOTRIN) 400 MG tablet Take 1.5 tablets (600 mg total) by mouth every 6 (six) hours as needed. 03/01/16   Kinnie Feil, PA-C  ibuprofen (ADVIL,MOTRIN) 600 MG tablet Take 1 tablet (600 mg total) by mouth every 6 (six) hours as needed. Patient not taking: Reported on 01/01/2016 12/11/15   Lavonia Drafts, MD    Family History Family History  Problem Relation Age of Onset  . Hypertension Mother   . Diabetes Mother   . Breast cancer Mother   . Hypertension Father   . Diabetes Father     Social History Social History  Substance Use Topics  . Smoking status: Never Smoker  . Smokeless tobacco: Never Used  . Alcohol use No     Allergies   Patient has no known allergies.   Review of Systems Review of Systems  Constitutional: Positive for fever.  HENT: Positive for sore throat.  Respiratory: Positive for cough.        Negative for hemoptysis  Gastrointestinal: Negative for diarrhea and vomiting.  Neurological: Positive for headaches.  All other systems reviewed and are negative.    Physical Exam Updated Vital Signs BP 138/68 (BP Location: Left Arm)   Pulse 71   Temp 98.5 F (36.9 C) (Oral)   Resp 18   SpO2 100%   Physical Exam CONSTITUTIONAL: Well developed/well nourished HEAD: Normocephalic/atraumatic EYES: EOMI/PERRL ENMT: Mucous membranes moist NECK: supple no meningeal signs SPINE/BACK:entire spine nontender CV: S1/S2 noted, no murmurs/rubs/gallops noted LUNGS: Lungs are clear to auscultation bilaterally,  no apparent distress, patient coughing frequently on exam ABDOMEN: soft, nontender, no rebound or guarding, bowel sounds noted throughout abdomen GU:no cva tenderness NEURO: Pt is awake/alert/appropriate, moves all extremitiesx4.  No facial droop.   EXTREMITIES: pulses normal/equal, full ROM SKIN: warm, color normal PSYCH: no abnormalities of mood noted, alert and oriented to situation   ED Treatments / Results  DIAGNOSTIC STUDIES:  Oxygen Saturation is 100% on RA, normal by my interpretation.    COORDINATION OF CARE:  2:08 AM Discussed treatment plan with pt at bedside which includes a breathing treatment and pt agreed to plan.  Labs (all labs ordered are listed, but only abnormal results are displayed) Labs Reviewed - No data to display  EKG  EKG Interpretation None       Radiology Dg Chest 2 View  Result Date: 05/18/2016 CLINICAL DATA:  Acute onset of cough and shortness of breath. Initial encounter. EXAM: CHEST  2 VIEW COMPARISON:  None. FINDINGS: The lungs are well-aerated and clear. There is no evidence of focal opacification, pleural effusion or pneumothorax. The heart is normal in size; the mediastinal contour is within normal limits. No acute osseous abnormalities are seen. IMPRESSION: No acute cardiopulmonary process seen. Electronically Signed   By: Garald Balding M.D.   On: 05/18/2016 01:20    Procedures Procedures (including critical care time)  Medications Ordered in ED Medications  albuterol (PROVENTIL) (2.5 MG/3ML) 0.083% nebulizer solution 5 mg (5 mg Nebulization Given 05/18/16 0218)  dexamethasone (DECADRON) tablet 8 mg (8 mg Oral Given 05/18/16 0218)     Initial Impression / Assessment and Plan / ED Course  I have reviewed the triage vital signs and the nursing notes.  Pertinent  imaging results that were available during my care of the patient were reviewed by me and considered in my medical decision making (see chart for details).     Pt  improved, probable flu like illness but had for several days so tamiflu unlikely to be helpful No distress BP 117/75   Pulse 99   Temp 98.5 F (36.9 C) (Oral)   Resp 17   SpO2 100%  Will d/c home rx for albuterol given I had to use son for interpreter as arabic interpreter not available currently at time of discharge via language line We discussed strict return precautions Final Clinical Impressions(s) / ED Diagnoses   Final diagnoses:  Cough    New Prescriptions Discharge Medication List as of 05/18/2016  4:02 AM    START taking these medications   Details  albuterol (PROVENTIL HFA;VENTOLIN HFA) 108 (90 Base) MCG/ACT inhaler Inhale 1-2 puffs into the lungs every 6 (six) hours as needed for wheezing or shortness of breath., Starting Sat 05/18/2016, Print      I personally performed the services described in this documentation, which was scribed in my presence. The recorded information has been reviewed and is accurate.  Ripley Fraise, MD 05/18/16 [phone]

## 2016-06-13 ENCOUNTER — Other Ambulatory Visit: Payer: Self-pay | Admitting: Obstetrics & Gynecology

## 2016-06-13 DIAGNOSIS — Z1231 Encounter for screening mammogram for malignant neoplasm of breast: Secondary | ICD-10-CM

## 2016-07-26 ENCOUNTER — Ambulatory Visit: Payer: BLUE CROSS/BLUE SHIELD

## 2016-08-06 NOTE — Congregational Nurse Program (Signed)
Congregational Nurse Program Note  Date of Encounter: 08/06/2016  Past Medical History: Past Medical History:  Diagnosis Date  . Anemia   . Hypercholesteremia     Encounter Details:     CNP Questionnaire - 08/06/16 1130      Patient Demographics   Is this a new or existing patient? Existing   Patient is considered a/an Refugee   Race Asian     Patient Assistance   Location of Patient Assistance Not Applicable   Patient's financial/insurance status Medicaid   Uninsured Patient ([facility]) No   Patient referred to apply for the following financial assistance Not Applicable   Food insecurities addressed Not Applicable   Transportation assistance No   Assistance securing medications No   Educational Programmer, multimedia the healthcare system     Encounter Details   Primary purpose of visit [place]   Was an Emergency Department visit averted? Not Applicable   Does patient have a medical provider? Yes   Patient referred to Follow up with established PCP   Was a mental health screening completed? (GAINS tool) No   Does patient have dental issues? Yes   Was a dental referral made? No resources for a referral   Does patient have vision issues? No   Does your patient have an abnormal blood pressure today? No   Since previous encounter, have you referred patient for abnormal blood pressure that resulted in a new diagnosis or medication change? No   Does your patient have an abnormal blood glucose today? No   Since previous encounter, have you referred patient for abnormal blood glucose that resulted in a new diagnosis or medication change? No   Was there a life-saving intervention made? No    Office visit to request assistance in verifying Medicaid coverage for self and spouse. Arabic speaking with low-moderate English ability. Expressed concern about annual Mammogram andl physical exam. Inquiry with [place] Medicaid confirmed Family  Planning Medicaid/limited coverage for one exam yearly. Last mammogram 07/26/15 and normal report. Referred to [facility] program [phone]) for scholarship. Jannetta Quint, RN/CN.

## 2016-08-14 LAB — GLUCOSE, POCT (MANUAL RESULT ENTRY): POC Glucose: 119 mg/dl — AB (ref 70–99)

## 2016-08-21 ENCOUNTER — Encounter: Payer: Self-pay | Admitting: Obstetrics and Gynecology

## 2016-08-21 ENCOUNTER — Ambulatory Visit (INDEPENDENT_AMBULATORY_CARE_PROVIDER_SITE_OTHER): Payer: BLUE CROSS/BLUE SHIELD | Admitting: Obstetrics and Gynecology

## 2016-08-21 VITALS — BP 100/80 | HR 79 | Temp 98.3°F | Ht 62.0 in | Wt 170.6 lb

## 2016-08-21 DIAGNOSIS — K219 Gastro-esophageal reflux disease without esophagitis: Secondary | ICD-10-CM

## 2016-08-21 DIAGNOSIS — Z8719 Personal history of other diseases of the digestive system: Secondary | ICD-10-CM

## 2016-08-21 LAB — POCT H PYLORI SCREEN: H Pylori Screen, POC: NEGATIVE

## 2016-08-21 MED ORDER — OMEPRAZOLE 40 MG PO CPDR: 40.0000 mg | DELAYED_RELEASE_CAPSULE | Freq: Every day | ORAL | 2 refills | Status: DC

## 2016-08-21 NOTE — Progress Notes (Signed)
   Subjective:   Patient ID: Tamara Proctor, female    DOB: [birth year], 46 y.o.   MRN: [id number]  Patient presents for Same Day Appointment. Video interpretor used.  Chief Complaint  Patient presents with  . Gastroesophageal Reflux    per patient    HPI: # GERD Patient states that she feels like she has inflammation of her stomach Back in [place] (her home [place] she was diagnosed with a hiatal hernia and GERD She used to have to be on medication for bacterial infections symptoms include coughing, burning, and increased phlegm Acid feeling goes from stomach to throat She is unable too lay down at night due to symptoms Has worsened over the last 4 days Has been taking otc nexium, tums, and nausene -Does help but takes prn Denies any blood Feels like she has had one temerpature and took tylenol  Severe symtpoms  Review of Systems   See HPI for ROS.   History  Smoking Status  . Never Smoker  Smokeless Tobacco  . Never Used    Past medical history, surgical, family, and social history reviewed and updated in the EMR as appropriate.  Pertinent Historical Findings include: HLD, anemia Objective:  BP 100/80 (BP Location: Right Arm, Patient Position: Sitting, Cuff Size: Normal)   Pulse 79   Temp 98.3 F (36.8 C) (Oral)   Ht 5\' 2"  (1.575 m)   Wt 170 lb 9.6 oz (77.4 kg)   SpO2 98%   BMI 31.20 kg/m  Vitals and nursing note reviewed  Physical Exam  Constitutional: She is well-developed, well-nourished, and in no distress.  HENT:  Mouth/Throat: Oropharynx is clear and moist.  Neck: Normal range of motion. Neck supple.  Cardiovascular: Normal rate.   Pulmonary/Chest: Effort normal.  Abdominal: Soft. Normal appearance and bowel sounds are normal. She exhibits no distension. There is no hepatosplenomegaly. There is tenderness in the epigastric area and left upper quadrant. There is no guarding.  Lymphadenopathy:    She has no cervical adenopathy.    Assessment &  Plan:  1. Gastroesophageal reflux disease, esophagitis presence not specified Symptoms consistent with GERD. Rx given for omeprazole. Patient to discontinue her home Nexium. H. Pylori testing in clinic was negative so no need for antibiotics at this time. Continue other conservative measures. Discussed changing diet to help with symptoms and avoiding laying down an hour after eating. Will refer her to GI due to severity of symptoms and to look for esophagitis.  - H. pylori Screen - Ambulatory referral to Gastroenterology  2. History of hiatal hernia Refer to GI for endoscopy and management.  - Ambulatory referral to Gastroenterology  Diagnosis and plan along with any newly prescribed medication(s) were discussed in detail with this patient today. The patient verbalized understanding and agreed with the plan. Patient advised if symptoms worsen return to clinic or ER.   PATIENT EDUCATION PROVIDED: See AVS   Luiz Blare, DO 08/21/2016, 4:05 PM PGY-3, [place]

## 2016-08-21 NOTE — Patient Instructions (Addendum)
You have GERD that is likely cause of your symptoms No signs of infection at this time  Hiatal Hernia A hiatal hernia occurs when part of the stomach slides above the muscle that separates the abdomen from the chest (diaphragm). A person can be born with a hiatal hernia (congenital), or it may develop over time. In almost all cases of hiatal hernia, only the top part of the stomach pushes through the diaphragm. Many people have a hiatal hernia with no symptoms. The larger the hernia, the more likely it is that you will have symptoms. In some cases, a hiatal hernia allows stomach acid to flow back into the tube that carries food from your mouth to your stomach (esophagus). This may cause heartburn symptoms. Severe heartburn symptoms may mean that you have developed a condition called gastroesophageal reflux disease (GERD). What are the causes? This condition is caused by a weakness in the opening (hiatus) where the esophagus passes through the diaphragm to attach to the upper part of the stomach. A person may be born with a weakness in the hiatus, or a weakness can develop over time. What increases the risk? This condition is more likely to develop in:  Older people. Age is a major risk factor for a hiatal hernia, especially if you are over the age of 31.  Pregnant women.  People who are overweight.  People who have frequent constipation. What are the signs or symptoms? Symptoms of this condition usually develop in the form of GERD symptoms. Symptoms include:  Heartburn.  Belching.  Indigestion.  Trouble swallowing.  Coughing or wheezing.  Sore throat.  Hoarseness.  Chest pain.  Nausea and vomiting. How is this diagnosed? This condition may be diagnosed during testing for GERD. Tests that may be done include:  X-rays of your stomach or chest.  An upper gastrointestinal (GI) series. This is an X-ray exam of your GI tract that is taken after you swallow a chalky liquid that  shows up clearly on the X-ray.  Endoscopy. This is a procedure to look into your stomach using a thin, flexible tube that has a tiny camera and light on the end of it. How is this treated? This condition may be treated by:  Dietary and lifestyle changes to help reduce GERD symptoms.  Medicines. These may include:  Over-the-counter antacids.  Medicines that make your stomach empty more quickly.  Medicines that block the production of stomach acid (H2 blockers).  Stronger medicines to reduce stomach acid (proton pump inhibitors).  Surgery to repair the hernia, if other treatments are not helping. If you have no symptoms, you may not need treatment. Follow these instructions at home: Lifestyle and activity   Do not use any products that contain nicotine or tobacco, such as cigarettes and e-cigarettes. If you need help quitting, ask your health care provider.  Try to achieve and maintain a healthy body weight.  Avoid putting pressure on your abdomen. Anything that puts pressure on your abdomen increases the amount of acid that may be pushed up into your esophagus.  Avoid bending over, especially after eating.  Raise the head of your bed by putting blocks under the legs. This keeps your head and esophagus higher than your stomach.  Do not wear tight clothing around your chest or stomach.  Try not to strain when having a bowel movement, when urinating, or when lifting heavy objects. Eating and drinking   Avoid foods that can worsen GERD symptoms. These may include:  Fatty foods,  like fried foods.  Citrus fruits, like oranges or lemon.  Other foods and drinks that contain acid, like orange juice or tomatoes.  Spicy food.  Chocolate.  Eat frequent small meals instead of three large meals a day. This helps prevent your stomach from getting too full.  Eat slowly.  Do not lie down right after eating.  Do not eat 1-2 hours before bed.  Do not drink beverages with  caffeine. These include cola, coffee, cocoa, and tea.  Do not drink alcohol. General instructions   Take over-the-counter and prescription medicines only as told by your health care provider.  Keep all follow-up visits as told by your health care provider. This is important. Contact a health care provider if:  Your symptoms are not controlled with medicines or lifestyle changes.  You are having trouble swallowing.  You have coughing or wheezing that will not go away. Get help right away if:  Your pain is getting worse.  Your pain spreads to your arms, neck, jaw, teeth, or back.  You have shortness of breath.  You sweat for no reason.  You feel sick to your stomach (nauseous) or you vomit.  You vomit blood.  You have bright red blood in your stools.  You have black, tarry stools. This information is not intended to replace advice given to you by your health care provider. Make sure you discuss any questions you have with your health care provider. Document Released: 06/01/2003 Document Revised: 03/04/2016 Document Reviewed: 03/04/2016 Elsevier Interactive Patient Education  2017 [employer].

## 2016-08-22 ENCOUNTER — Encounter: Payer: Self-pay | Admitting: Physician Assistant

## 2016-08-28 ENCOUNTER — Encounter: Payer: Self-pay | Admitting: Family Medicine

## 2016-08-28 ENCOUNTER — Ambulatory Visit (INDEPENDENT_AMBULATORY_CARE_PROVIDER_SITE_OTHER): Payer: Self-pay | Admitting: Family Medicine

## 2016-08-28 VITALS — BP 101/80 | HR 72 | Temp 98.1°F | Wt 171.4 lb

## 2016-08-28 DIAGNOSIS — R059 Cough, unspecified: Secondary | ICD-10-CM

## 2016-08-28 DIAGNOSIS — R7309 Other abnormal glucose: Secondary | ICD-10-CM

## 2016-08-28 DIAGNOSIS — R05 Cough: Secondary | ICD-10-CM

## 2016-08-28 LAB — POCT GLYCOSYLATED HEMOGLOBIN (HGB A1C): Hemoglobin A1C: 5.7

## 2016-08-28 NOTE — Patient Instructions (Addendum)
  Please help patient find [place] [facility] to help with phlegm and cough.

## 2016-08-28 NOTE — Progress Notes (Signed)
    Subjective:    Patient ID: Tamara Proctor, female    DOB: 05/14/1970, 46 y.o.   MRN: [id number]   CC: hyperglycemia, cough  HPI: Worried about high blood sugars and if she has DM. No increased thirst, urination. Was told in [place] her sugars were high.   Cough Has had cough for about 10 days, coughs up some green phlegm. Denies fevers or chills, no night sweats, no weight loss. No runny nose, sore throat.  She had a URI a couple of weeks ago. Has not tried any medications.  Smoking status reviewed- non smoker  Review of Systems- see HPI   Objective:  BP 101/80 (BP Location: Left Arm, Patient Position: Sitting, Cuff Size: Normal)   Pulse 72   Temp 98.1 F (36.7 C) (Oral)   Wt 171 lb 6.4 oz (77.7 kg)   LMP  (LMP Unknown)   SpO2 97%   BMI 31.35 kg/m  Vitals and nursing note reviewed  General: well nourished, in no acute distress Cardiac: RRR, clear S1 and S2, no murmurs, rubs, or gallops Respiratory: clear to auscultation bilaterally, no increased work of breathing Neuro: alert and oriented, no focal deficits  Assessment & Plan:    Elevated glucose Concerning to patient.  -A1C today 5.7, in pre-diabetic range -recommended lifestyle changes, weight loss, exercise -follow up as needed  Cough Likely post-viral cough. No concerning history for pneumonia or TB.  -advised honey for cough relief -advised Mucinex to thin phlegm -follow up if symptoms worsen  Health Maintenance Due for mammogram, information given so patient can request an appointment for screening  Return in about 6 months (around 02/27/2017), or if symptoms worsen or fail to improve.  Lucila Maine, DO Family Medicine Resident PGY-1

## 2016-08-28 NOTE — Assessment & Plan Note (Addendum)
Concerning to patient.  -A1C today 5.7, in pre-diabetic range -recommended lifestyle changes, weight loss, exercise -follow up as needed

## 2016-08-29 ENCOUNTER — Encounter: Payer: Self-pay | Admitting: Physician Assistant

## 2016-08-29 ENCOUNTER — Ambulatory Visit (INDEPENDENT_AMBULATORY_CARE_PROVIDER_SITE_OTHER): Payer: Self-pay | Admitting: Physician Assistant

## 2016-08-29 DIAGNOSIS — K219 Gastro-esophageal reflux disease without esophagitis: Secondary | ICD-10-CM | POA: Insufficient documentation

## 2016-08-29 MED ORDER — RANITIDINE HCL 150 MG PO CAPS
ORAL_CAPSULE | ORAL | 6 refills | Status: DC
Start: 2016-08-29 — End: 2016-08-29

## 2016-08-29 MED ORDER — PANTOPRAZOLE SODIUM 40 MG PO TBEC: DELAYED_RELEASE_TABLET | ORAL | 6 refills | Status: DC

## 2016-08-29 MED ORDER — RANITIDINE HCL 150 MG PO CAPS: ORAL_CAPSULE | ORAL | 6 refills | Status: DC

## 2016-08-29 NOTE — Patient Instructions (Signed)
We sent prescriptions to [facility].  1. Pantoprazole Sodum 40 mg 2. Zantac 150 mg.   IB Gard- take 2 tablets twice daily.   Follow up with Amy Esterwood PA in 2-3 months.

## 2016-08-29 NOTE — Progress Notes (Addendum)
Subjective:    Patient ID: Tamara Proctor, female    DOB: 1971-01-26, 46 y.o.   MRN: [id number]  HPI Tamara Proctor is a 46 year old [place] female, non-English speaking who is referred by Dr. Gerarda Fraction for evaluation of GERD symptoms.  Patient is accompanied by an interpreter. She states she is been in the [place] about 17 months. She had been diagnosed with acid reflux about 25 years ago and says she did have a very remote EGD done in [place]. She has been on PPI therapy off and on over the past many years. She has had a very recent exacerbation of symptoms with heartburn and indigestion which had been particularly bad at night time after she would lay down. She has just started on omeprazole 40 mg by mouth every morning over the past 10 days and says that her symptoms are much better but have not resolved. She denies any odynophagia, has had some vague intermittent dysphagia symptoms improved with omeprazole. Appetite appetite has been decreased recently but her weight has been stable. She states that sometimes the acid reflux gets so bad she has a sore throat. She has no complaints of abdominal pain, bowel movements have been normal no melena or hematochezia. She also says that she's been given a diagnosis of IBS and wonders if she can have something to call her down. 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 She admits to a lot of anxiety recently. Family history negative for colon cancer and polyps. Other medical problems include history of iron deficiency anemia and hyperlipidemia. No prior abdominal surgeries  Review of Systems Pertinent positive and negative review of systems were noted in the above HPI section.  All other review of systems was otherwise negative.  Outpatient Encounter Prescriptions as of 08/29/2016  Medication Sig  . acetaminophen (TYLENOL) 325 MG tablet Take 2 tablets (650  mg total) by mouth every 6 (six) hours as needed.  Marland Kitchen omeprazole (PRILOSEC) 40 MG capsule Take 1 capsule (40 mg total) by mouth daily.  . pantoprazole (PROTONIX) 40 MG tablet Take 1 tablet by mouth every morning.  . ranitidine (ZANTAC) 150 MG capsule Take 1 tab at bedtime.  . [DISCONTINUED] albuterol (PROVENTIL HFA;VENTOLIN HFA) 108 (90 Base) MCG/ACT inhaler Inhale 1-2 puffs into the lungs every 6 (six) hours as needed for wheezing or shortness of breath.  . [DISCONTINUED] ibuprofen (ADVIL,MOTRIN) 600 MG tablet Take 1 tablet (600 mg total) by mouth every 6 (six) hours as needed.  . [DISCONTINUED] pantoprazole (PROTONIX) 40 MG tablet Take 1 tablet by mouth every morning.  . [DISCONTINUED] ranitidine (ZANTAC) 150 MG capsule Take 1 tab at bedtime.   No facility-administered encounter medications on file as of 08/29/2016.    No Known Allergies Patient Active Problem List   Diagnosis Date Noted  . Elevated glucose 08/28/2016  . Sciatica of left side 04/05/2016  . Iron deficiency anemia 04/05/2016  . Hyperlipidemia 04/05/2016   Social History   Social History  . Marital status: Married    Spouse name: N/A  . Number of children: N/A  . Years of education: N/A   Occupational History  . Not on file.   Social History Main Topics  . Smoking status: Never Smoker  . Smokeless tobacco: Never Used  . Alcohol use No  . Drug use: No  . Sexual activity: Yes    Partners: Male    Birth  control/ protection: Condom   Other Topics Concern  . Not on file   Social History Narrative  . No narrative on file    Ms. Fraizer's family history includes Breast cancer in her mother; Diabetes in her father and mother; Hypertension in her father and mother.      Objective:    Vitals:   08/29/16 1330  BP: 98/70  Pulse: 70    Physical Exam;Well-developed female in no acute distress, accompanied by interpreter. Blood pressure 98/70 pulse 70 height 5 foot 1, weight 171, BMI 32.3. HEENT ;nontraumatic,  cephalic EOMI PERRLA sclera anicteric, Cardiovascular; regular rate and rhythm with S1-S2 no murmur or gallop, Pulmonary ;clear bilaterally, Abdomen ;soft, she has some mild tenderness across the upper abdomen and left upper quadrant no guarding or rebound no palpable mass or hepatosplenomegaly bowel sounds are present, Rectal ;exam not done, Ext; no clubbing cyanosis or edema skin warm dry, Neuropsych ;mood and affect appropriate       Assessment & Plan:   #93 46 year old [place] female, non-English speaking with long history of GERD with recent exacerbation while off medication. Symptoms have improved but not resolved with omeprazole. #2 history of IBS with primary complaints of bloating and gas # 3 anxiety #4 hyperlipidemia  Plan; Pt  is asking for stronger medication. Will switch to pantoprazole 40 mg by mouth every morning and ranitidine 150 mg every at bedtime Reviewed in antireflux regimen including nothing by mouth for 3 hours prior to bed and elevation of the head of the bed 45 Start   IB Gard , 2 by mouth twice daily Asked patient to see her PCP if she feels that she needs medication for anxiety We'll plan to follow up in the office in 2-3 months. Patient will be established with Dr. Leverne Humbles PA-C 08/29/2016   Cc: Katheren Shams, DO   Thank you for sending this case to me. I have reviewed the entire note, and the outlined plan seems appropriate.   Wilfrid Lund, MD

## 2016-12-27 ENCOUNTER — Telehealth: Payer: Self-pay | Admitting: Family Medicine

## 2016-12-27 NOTE — Telephone Encounter (Signed)
Changed pharmacy. Tamara Proctor,CMA

## 2016-12-27 NOTE — Telephone Encounter (Signed)
Please send all future prescriptions to the [facility] [address] in [place], not [facility] in [place].  thanks

## 2017-04-03 ENCOUNTER — Other Ambulatory Visit: Payer: Self-pay

## 2017-04-03 ENCOUNTER — Encounter: Payer: Self-pay | Admitting: Family Medicine

## 2017-04-03 ENCOUNTER — Ambulatory Visit: Payer: BLUE CROSS/BLUE SHIELD | Admitting: Family Medicine

## 2017-04-03 VITALS — BP 118/84 | HR 71 | Temp 98.9°F | Wt 183.0 lb

## 2017-04-03 DIAGNOSIS — E785 Hyperlipidemia, unspecified: Secondary | ICD-10-CM

## 2017-04-03 DIAGNOSIS — M25562 Pain in left knee: Secondary | ICD-10-CM

## 2017-04-03 DIAGNOSIS — M25561 Pain in right knee: Secondary | ICD-10-CM | POA: Diagnosis not present

## 2017-04-03 DIAGNOSIS — D509 Iron deficiency anemia, unspecified: Secondary | ICD-10-CM | POA: Diagnosis not present

## 2017-04-03 DIAGNOSIS — R5383 Other fatigue: Secondary | ICD-10-CM | POA: Diagnosis not present

## 2017-04-03 DIAGNOSIS — Z1231 Encounter for screening mammogram for malignant neoplasm of breast: Secondary | ICD-10-CM | POA: Diagnosis not present

## 2017-04-03 DIAGNOSIS — G8929 Other chronic pain: Secondary | ICD-10-CM | POA: Diagnosis not present

## 2017-04-03 MED ORDER — TRAMADOL HCL 50 MG PO TABS: 50.0000 mg | ORAL_TABLET | Freq: Two times a day (BID) | ORAL | 0 refills | Status: DC | PRN

## 2017-04-03 NOTE — Patient Instructions (Addendum)
Please make an appointment to discuss mood and fatigue.   If you have questions or concerns please do not hesitate to call at [phone]. Lucila Maine, DO PGY-2, [facility] 04/03/2017 4:39 PM   Arthritis Arthritis is a term that is commonly used to refer to joint pain or joint disease. There are more than 100 types of arthritis. What are the causes? The most common cause of this condition is wear and tear of a joint. Other causes include:  Gout.  Inflammation of a joint.  An infection of a joint.  Sprains and other injuries near the joint.  A drug reaction or allergic reaction.  In some cases, the cause may not be known. What are the signs or symptoms? The main symptom of this condition is pain in the joint with movement. Other symptoms include:  Redness, swelling, or stiffness at a joint.  Warmth coming from the joint.  Fever.  Overall feeling of illness.  How is this diagnosed? This condition may be diagnosed with a physical exam and tests, including:  Blood tests.  Urine tests.  Imaging tests, such as MRI, X-rays, or a CT scan.  Sometimes, fluid is removed from a joint for testing. How is this treated? Treatment for this condition may involve:  Treatment of the cause, if it is known.  Rest.  Raising (elevating) the joint.  Applying cold or hot packs to the joint.  Medicines to improve symptoms and reduce inflammation.  Injections of a steroid such as cortisone into the joint to help reduce pain and inflammation.  Depending on the cause of your arthritis, you may need to make lifestyle changes to reduce stress on your joint. These changes may include exercising more and losing weight. Follow these instructions at home: Medicines  Take over-the-counter and prescription medicines only as told by your health care provider.  Do not take aspirin to relieve pain if gout is suspected. Activity  Rest your joint if told by your health  care provider. Rest is important when your disease is active and your joint feels painful, swollen, or stiff.  Avoid activities that make the pain worse. It is important to balance activity with rest.  Exercise your joint regularly with range-of-motion exercises as told by your health care provider. Try doing low-impact exercise, such as: ? Swimming. ? Water aerobics. ? Biking. ? Walking. Joint Care   If your joint is swollen, keep it elevated if told by your health care provider.  If your joint feels stiff in the morning, try taking a warm shower.  If directed, apply heat to the joint. If you have diabetes, do not apply heat without permission from your health care provider. ? Put a towel between the joint and the hot pack or heating pad. ? Leave the heat on the area for 20-30 minutes.  If directed, apply ice to the joint: ? Put ice in a plastic bag. ? Place a towel between your skin and the bag. ? Leave the ice on for 20 minutes, 2-3 times per day.  Keep all follow-up visits as told by your health care provider. This is important. Contact a health care provider if:  The pain gets worse.  You have a fever. Get help right away if:  You develop severe joint pain, swelling, or redness.  Many joints become painful and swollen.  You develop severe back pain.  You develop severe weakness in your leg.  You cannot control your bladder or bowels. This information is  not intended to replace advice given to you by your health care provider. Make sure you discuss any questions you have with your health care provider. Document Released: 04/18/2004 Document Revised: 08/17/2015 Document Reviewed: 06/06/2014 Elsevier Interactive Patient Education  [employer].

## 2017-04-03 NOTE — Progress Notes (Signed)
    Subjective:    Patient ID: Tamara Proctor, female    DOB: [birth year], 47 y.o.   MRN: [id number]   CC: bilateral knee pain  Knees hurt to bend. Not taking any medication. Worse in colder weathers. Has been an issue for years. Has never had x-rays. 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 She states she has had work up in her home country and knows this is arthritis. She would not want knee replacement, injections. She is declining imaging today. Knees occasionally get swollen. They do not get red or hot. No fevers or chills.   Requesting referral for mammogram  She is also having fatigue. She wants to know why she is tired all the time and what supplements she can take to have more energy.   Smoking status reviewed- non-smoker  Review of Systems- see HPI   Objective:  BP 118/84   Pulse 71   Temp 98.9 F (37.2 C) (Oral)   Wt 183 lb (83 kg)   LMP 03/20/2017   SpO2 98%   BMI 34.58 kg/m  Vitals and nursing note reviewed  General: well nourished, in no acute distress Cardiac: RRR, clear S1 and S2, no murmurs, rubs, or gallops Respiratory: clear to auscultation bilaterally, no increased work of breathing Extremities: knee joints without effusion bilaterally, non-tender to palpation Skin: warm and dry, no rashes noted Neuro: alert and oriented, no focal deficits   Assessment & Plan:    Chronic pain of both knees  Likely due to arthritis. Discussed long term options with patient. Discussed weight loss to help relieve pressure on knees. Continue tylenol and ibuprofen as needed for pain. Can use ice to help with swelling when it occurs. Patient is not interested in invasive treatments including injections or knee replacement. -rx for tramadol given for severe pain -follow up as needed   Fatigue  Patient reporting low energy. Asking for supplements to help improve energy.  -check CBC, TSH today -follow up to discuss this further at future  appointment  Hyperlipidemia  Repeat lipid panel today, last ASCVD risk score 1.1%. Not on any medication.  Health maintenance -referral for mammogram placed  Return in about 4 weeks (around 05/01/2017).   Lucila Maine, DO Family Medicine Resident PGY-2

## 2017-04-04 DIAGNOSIS — M25562 Pain in left knee: Principal | ICD-10-CM

## 2017-04-04 DIAGNOSIS — G8929 Other chronic pain: Secondary | ICD-10-CM | POA: Insufficient documentation

## 2017-04-04 DIAGNOSIS — M25561 Pain in right knee: Principal | ICD-10-CM

## 2017-04-04 DIAGNOSIS — R5383 Other fatigue: Secondary | ICD-10-CM | POA: Insufficient documentation

## 2017-04-04 LAB — CMP14+EGFR
ALT: 14 IU/L (ref 0–32)
AST: 17 IU/L (ref 0–40)
Albumin/Globulin Ratio: 1.7 (ref 1.2–2.2)
Albumin: 4.5 g/dL (ref 3.5–5.5)
Alkaline Phosphatase: 63 IU/L (ref 39–117)
BUN/Creatinine Ratio: 22 (ref 9–23)
BUN: 14 mg/dL (ref 6–24)
Bilirubin Total: 0.2 mg/dL (ref 0.0–1.2)
CALCIUM: 9.3 mg/dL (ref 8.7–10.2)
CO2: 23 mmol/L (ref 20–29)
CREATININE: 0.65 mg/dL (ref 0.57–1.00)
Chloride: 103 mmol/L (ref 96–106)
GFR calc Af Amer: 121 mL/min/{1.73_m2} (ref >59)
GFR, EST NON AFRICAN AMERICAN: 105 mL/min/{1.73_m2} (ref >59)
GLOBULIN, TOTAL: 2.7 g/dL (ref 1.5–4.5)
Glucose: 88 mg/dL (ref 65–99)
Potassium: 4 mmol/L (ref 3.5–5.2)
Sodium: 142 mmol/L (ref 134–144)
TOTAL PROTEIN: 7.2 g/dL (ref 6.0–8.5)

## 2017-04-04 LAB — LIPID PANEL
CHOL/HDL RATIO: 4.3 ratio (ref 0.0–4.4)
CHOLESTEROL TOTAL: 230 mg/dL — AB (ref 100–199)
HDL: 53 mg/dL (ref >39)
LDL CALC: 156 mg/dL — AB (ref 0–99)
Triglycerides: 103 mg/dL (ref 0–149)
VLDL Cholesterol Cal: 21 mg/dL (ref 5–40)

## 2017-04-04 LAB — CBC
HEMOGLOBIN: 12.1 g/dL (ref 11.1–15.9)
Hematocrit: 37.2 % (ref 34.0–46.6)
MCH: 27.8 pg (ref 26.6–33.0)
MCHC: 32.5 g/dL (ref 31.5–35.7)
MCV: 86 fL (ref 79–97)
Platelets: 280 10*3/uL (ref 150–379)
RBC: 4.35 x10E6/uL (ref 3.77–5.28)
RDW: 14.3 % (ref 12.3–15.4)
WBC: 5.9 10*3/uL (ref 3.4–10.8)

## 2017-04-04 LAB — TSH: TSH: 1.54 u[IU]/mL (ref 0.450–4.500)

## 2017-04-04 NOTE — Assessment & Plan Note (Addendum)
  Repeat lipid panel today, last ASCVD risk score 1.1%. Not on any medication.

## 2017-04-04 NOTE — Assessment & Plan Note (Signed)
  Patient reporting low energy. Asking for supplements to help improve energy.  -check CBC, TSH today -follow up to discuss this further at future appointment

## 2017-04-04 NOTE — Assessment & Plan Note (Signed)
  Likely due to arthritis. Discussed long term options with patient. Discussed weight loss to help relieve pressure on knees. Continue tylenol and ibuprofen as needed for pain. Can use ice to help with swelling when it occurs. Patient is not interested in invasive treatments including injections or knee replacement. -rx for tramadol given for severe pain -follow up as needed

## 2017-04-07 ENCOUNTER — Encounter: Payer: Self-pay | Admitting: Family Medicine

## 2017-04-07 NOTE — Progress Notes (Signed)
  Sent letter to patient with normal lab results. ASCVD risk 1.1%, no indication for statin therapy at this time.   Lucila Maine, DO PGY-2, [facility] 04/07/2017 1:42 PM

## 2017-04-25 ENCOUNTER — Ambulatory Visit
Admission: RE | Admit: 2017-04-25 | Discharge: 2017-04-25 | Disposition: A | Discharge status: Home / Self Care | Payer: BLUE CROSS/BLUE SHIELD | Source: Ambulatory Visit | Attending: Family Medicine | Admitting: Family Medicine

## 2017-04-25 DIAGNOSIS — Z1231 Encounter for screening mammogram for malignant neoplasm of breast: Secondary | ICD-10-CM

## 2017-04-28 ENCOUNTER — Other Ambulatory Visit: Payer: Self-pay | Admitting: Family Medicine

## 2017-04-28 DIAGNOSIS — R928 Other abnormal and inconclusive findings on diagnostic imaging of breast: Secondary | ICD-10-CM

## 2017-04-29 ENCOUNTER — Ambulatory Visit: Payer: BLUE CROSS/BLUE SHIELD

## 2017-04-30 ENCOUNTER — Ambulatory Visit
Admission: RE | Admit: 2017-04-30 | Discharge: 2017-04-30 | Disposition: A | Discharge status: Home / Self Care | Payer: BLUE CROSS/BLUE SHIELD | Source: Ambulatory Visit | Attending: Family Medicine | Admitting: Family Medicine

## 2017-04-30 ENCOUNTER — Other Ambulatory Visit: Payer: Self-pay | Admitting: Family Medicine

## 2017-04-30 DIAGNOSIS — R928 Other abnormal and inconclusive findings on diagnostic imaging of breast: Secondary | ICD-10-CM

## 2017-04-30 DIAGNOSIS — N644 Mastodynia: Secondary | ICD-10-CM

## 2017-05-08 ENCOUNTER — Ambulatory Visit: Payer: BLUE CROSS/BLUE SHIELD | Admitting: Family Medicine

## 2017-05-08 VITALS — BP 112/84 | HR 85 | Temp 98.2°F | Wt 182.6 lb

## 2017-05-08 DIAGNOSIS — L814 Other melanin hyperpigmentation: Secondary | ICD-10-CM

## 2017-05-08 DIAGNOSIS — E785 Hyperlipidemia, unspecified: Secondary | ICD-10-CM

## 2017-05-08 DIAGNOSIS — M2550 Pain in unspecified joint: Secondary | ICD-10-CM

## 2017-05-08 DIAGNOSIS — R0602 Shortness of breath: Secondary | ICD-10-CM

## 2017-05-08 NOTE — Progress Notes (Signed)
    Subjective:    Patient ID: Tamara Proctor, female    DOB: 01/11/1971, 47 y.o.   MRN: [id number]  CC: "discuss labs"  Video interpreter used for this encounter:   Patient would like to review her recent labs. She did not receive a letter in the mail. She also would like to have her mammogram results explained to her.  She also complains of diffuse joint pain and chronic fatigue. She states she was diagnosed with rheumatoid arthritis when she was in her 42's in her [place]. She was prescribed a medication she cannot recall the name of that she took for a period of time then stopped. She reports her joints occasionally swell and last week her left wrist was swollen and red. She denies fevers, chills, night sweats, unintentional weight loss, rashes anywhere.  She also complains of intermittent occasional shortness of breath. She denies any chest pain or diaphoresis when this occurs. It can occur at any time no matter if she is sitting still or walking around. She does endorse feeling anxious with these episodes. She is not currently short of breath.  She also complains of skin discoloration on her cheeks that occurred after she got pregnant and has never gone away. She would like to see a dermatologist about this.   Smoking status reviewed- non-smoker  Review of Systems- see HPI   Objective:  BP 112/84 (BP Location: Left Arm, Patient Position: Sitting, Cuff Size: Normal)   Pulse 85   Temp 98.2 F (36.8 C) (Oral)   Wt 182 lb 9.6 oz (82.8 kg)   SpO2 97%   BMI 34.50 kg/m  Vitals and nursing note reviewed  General: well nourished, in no acute distress HEENT: normocephalic, moist mucous membranes, good dentition without erythema or discharge noted in posterior oropharynx Neck: supple, non-tender, without lymphadenopathy Cardiac: RRR, clear S1 and S2, no murmurs, rubs, or gallops Respiratory: clear to auscultation bilaterally, no increased work of breathing Extremities: no  edema or cyanosis.  MSK: no joint swelling or redness noted on exam Skin: warm and dry, no rashes noted Neuro: alert and oriented, no focal deficits   Assessment & Plan:   1. Hyperlipidemia, unspecified hyperlipidemia type Reviewed results with patient of cholesterol test. Encouraged weight loss and healthy diet to aid in managing cholesterol. Recheck 1 year. ASCVD risk 1%  2. Shortness of breath Patient endorses anxiety. No concerning lung or heart findings on exam. She is well appearing and in no distress on exam. Asked her to proceed to emergency room if she experiences SOB.   3. Multiple joint pain Patient states she was diagnosed with rheumatoid arthritis in her 20's. Will obtain some labs to investigate for autoimmune etiology for joint pain. Continue tylenol for now. If labs positive will refer to rheumatology. - ANA - C-reactive protein - Rheumatoid factor - Sedimentation Rate  4. Darkening of skin Cheeks bilaterally with mildly darkened skin -referral to dermatology per patient's request  Return as needed.   Lucila Maine, DO Family Medicine Resident PGY-2

## 2017-05-08 NOTE — Patient Instructions (Addendum)
  It was good to see you today.  I will contact you with your lab results.  If you have questions or concerns please do not hesitate to call at [phone].  Lucila Maine, DO PGY-2, [facility] 05/08/2017 3:46 PM

## 2017-05-09 LAB — SEDIMENTATION RATE: SED RATE: 10 mm/h (ref 0–32)

## 2017-05-09 LAB — C-REACTIVE PROTEIN: CRP: 2.8 mg/L (ref 0.0–4.9)

## 2017-05-09 LAB — ANA: Anti Nuclear Antibody(ANA): NEGATIVE

## 2017-05-09 LAB — RHEUMATOID FACTOR: Rhuematoid fact SerPl-aCnc: <10 IU/mL (ref 0.0–13.9)

## 2017-05-20 NOTE — Progress Notes (Signed)
Can we make Tamara Proctor an appointment to come in and talk about her labs? They were negative indicating no rheumatoid arthritis or other autoimmune process going on. I'd like to get an x-ray of her wrist/other affected joints. I feel it would be best to talk to her in person. Thanks!

## 2017-05-23 ENCOUNTER — Telehealth: Payer: Self-pay | Admitting: *Deleted

## 2017-05-23 ENCOUNTER — Encounter: Payer: Self-pay | Admitting: *Deleted

## 2017-05-23 NOTE — Telephone Encounter (Signed)
Tried calling patient ok her husbands number ok per DPR but there was no answer or voicemail available.  Will mail letter asking her to contact us to make an appointment. Jazmin Hartsell,CMA

## 2017-05-23 NOTE — Telephone Encounter (Signed)
-----   Message from Steve Rattler, DO sent at 05/20/2017  1:42 PM EST ----- Can we make Tamara Proctor an appointment to come in and talk about her labs? They were negative indicating no rheumatoid arthritis or other autoimmune process going on. I'd like to get an x-ray of her wrist/other affected joints. I feel it would be best to talk to her in person. Thanks!

## 2017-06-05 ENCOUNTER — Ambulatory Visit: Payer: BLUE CROSS/BLUE SHIELD | Admitting: Family Medicine

## 2017-06-05 ENCOUNTER — Other Ambulatory Visit: Payer: Self-pay

## 2017-06-05 ENCOUNTER — Encounter: Payer: Self-pay | Admitting: Family Medicine

## 2017-06-05 VITALS — BP 120/60 | HR 74 | Temp 98.2°F | Ht 61.0 in | Wt 182.0 lb

## 2017-06-05 DIAGNOSIS — M25562 Pain in left knee: Secondary | ICD-10-CM | POA: Diagnosis not present

## 2017-06-05 DIAGNOSIS — M2550 Pain in unspecified joint: Secondary | ICD-10-CM

## 2017-06-05 DIAGNOSIS — M25561 Pain in right knee: Secondary | ICD-10-CM

## 2017-06-05 DIAGNOSIS — G8929 Other chronic pain: Secondary | ICD-10-CM

## 2017-06-05 MED ORDER — ALBUTEROL SULFATE 108 (90 BASE) MCG/ACT IN AEPB: 2.0000 | INHALATION_SPRAY | Freq: Four times a day (QID) | RESPIRATORY_TRACT | 3 refills | Status: AC | PRN

## 2017-06-05 MED ORDER — TRAMADOL HCL 50 MG PO TABS: 50.0000 mg | ORAL_TABLET | Freq: Two times a day (BID) | ORAL | 0 refills | Status: DC | PRN

## 2017-06-05 MED ORDER — ASPIRIN EC 81 MG PO TBEC: 81.0000 mg | DELAYED_RELEASE_TABLET | Freq: Every day | ORAL | 3 refills | Status: DC

## 2017-06-05 MED ORDER — DICLOFENAC SODIUM 1 % TD GEL: 2.0000 g | Freq: Four times a day (QID) | TRANSDERMAL | 3 refills | Status: DC

## 2017-06-05 MED ORDER — ALBUTEROL SULFATE 108 (90 BASE) MCG/ACT IN AEPB: 2.0000 | INHALATION_SPRAY | Freq: Four times a day (QID) | RESPIRATORY_TRACT | 3 refills | Status: DC | PRN

## 2017-06-05 MED ORDER — ASPIRIN EC 81 MG PO TBEC: 81.0000 mg | DELAYED_RELEASE_TABLET | Freq: Every day | ORAL | 3 refills | Status: AC

## 2017-06-05 NOTE — Progress Notes (Signed)
    Subjective:    Patient ID: Tamara Proctor, female    DOB: Aug 11, 1970, 47 y.o.   MRN: [id number]   CC: follow up labs  HPI: patient here today to discuss recent labs done to determine cause of joint pain. She states her pain is in her upper back, both wrists, and both knees. She states the pain is worse with activity. It is worse during the winter months and gets better during summer months. Joints occasionally seem to swell. She denies redness or heat accompanying joint swelling. She denies any rash or weight loss. She has tried tylenol without much relief. She cannot take NSAIDs due to stomach upset. She does not want to get any imaging done of her joints. She is interested in pain medication and states she feels she would need to take something 1-2 times a day for pain. She was unable to fill tramadol prescribed at last appointment due to pharmacy issue.   She would also like a refill on albuterol and an aspirin prescription.  Smoking status reviewed- non-smoker  Review of Systems- see HPI   Objective:  BP 120/60   Pulse 74   Temp 98.2 F (36.8 C) (Oral)   Ht '5\' 1"'$  (1.549 m)   Wt 182 lb (82.6 kg)   SpO2 98%   BMI 34.39 kg/m  Vitals and nursing note reviewed  General: well nourished, in no acute distress Cardiac: RRR, clear S1 and S2, no murmurs, rubs, or gallops Respiratory: clear to auscultation bilaterally, no increased work of breathing Extremities: no edema or cyanosis. No joint deformities or joint effusions noted in wrists or knees. Normal ROM. Skin: warm and dry, no rashes noted Neuro: alert and oriented, no focal deficits, normal gait   Assessment & Plan:    1. Multiple joint pain Discussed negative RF, ANA, ESR, CRP making rheumatoid arthritis or other inflammatory/autoimmune disorder unlikely. Offered x-ray of wrists and knees to see if there are any changes consistent with RA or OA. Patient declined. Discussed the fact that there is little we can do to  get joint pain to go away completely. Encouraged daily exercise. Advised patient to take tylenol twice a day. Rx given for voltaren gel to use QID as needed on wrist/knees in addition. Rx for tramadol given for severe, breakthrough pain. Patient verbalized understanding that tramadol should not be used every day. Follow up as needed.  2. Chronic pain of both knees See plan as above. Pain in knees likely OA and discussed weight loss to help with relieving pain.   Return if symptoms worsen or fail to improve.   Lucila Maine, DO Family Medicine Resident PGY-2

## 2017-06-05 NOTE — Patient Instructions (Signed)
  Please take tylenol twice a day every day. You can use the gel on your joints up to four times a day.  Please reserve the pain medication for severe uncontrolled pain.  If you have questions or concerns please do not hesitate to call at [phone].  Lucila Maine, DO PGY-2, [facility] 06/05/2017 3:47 PM

## 2017-06-09 ENCOUNTER — Telehealth: Payer: Self-pay

## 2017-06-09 NOTE — Telephone Encounter (Signed)
Received fax from [facility] requesting prior authorization of Voltaren gel. PA completed via CoverMyMeds. Status pending. Will recheck in 24 hours. Danley Danker, RN [facility] RN)

## 2017-06-12 NOTE — Telephone Encounter (Signed)
Received fax from [facility] that Voltaren gel is denied. Re-submitted request with additional records as requested. Will await response. Danley Danker, RN [facility] RN)

## 2017-06-13 NOTE — Telephone Encounter (Signed)
PA for Voltaren gel denied per [employer]. Danley Danker, RN [facility] RN)

## 2017-06-18 NOTE — Telephone Encounter (Signed)
Thank you for your help with this!

## 2017-06-18 NOTE — Telephone Encounter (Signed)
Received second request for PA for Diclofenac gel. Re-submitted a third time through CoverMyMeds and med was approved. [facility] notified. Danley Danker, RN [facility] RN)

## 2017-07-02 ENCOUNTER — Encounter: Payer: Self-pay | Admitting: Family Medicine

## 2017-07-02 ENCOUNTER — Other Ambulatory Visit: Payer: Self-pay

## 2017-07-02 ENCOUNTER — Ambulatory Visit: Payer: BLUE CROSS/BLUE SHIELD | Admitting: Family Medicine

## 2017-07-02 VITALS — BP 116/74 | HR 76 | Temp 98.1°F | Ht 61.0 in | Wt 183.0 lb

## 2017-07-02 DIAGNOSIS — M2550 Pain in unspecified joint: Secondary | ICD-10-CM | POA: Diagnosis not present

## 2017-07-02 DIAGNOSIS — F3289 Other specified depressive episodes: Secondary | ICD-10-CM | POA: Diagnosis not present

## 2017-07-02 MED ORDER — RANITIDINE HCL 150 MG PO CAPS: ORAL_CAPSULE | ORAL | 6 refills | Status: AC

## 2017-07-02 MED ORDER — ESCITALOPRAM OXALATE 10 MG PO TABS: 10.0000 mg | ORAL_TABLET | Freq: Every day | ORAL | 2 refills | Status: AC

## 2017-07-02 MED ORDER — DICLOFENAC SODIUM 1 % TD GEL: 2.0000 g | Freq: Four times a day (QID) | TRANSDERMAL | 3 refills | Status: AC

## 2017-07-02 MED ORDER — TRAMADOL HCL 50 MG PO TABS: 50.0000 mg | ORAL_TABLET | Freq: Two times a day (BID) | ORAL | 0 refills | Status: DC | PRN

## 2017-07-02 MED ORDER — OMEPRAZOLE 40 MG PO CPDR: 40.0000 mg | DELAYED_RELEASE_CAPSULE | Freq: Every day | ORAL | 2 refills | Status: DC

## 2017-07-02 NOTE — Progress Notes (Signed)
    Subjective:    Patient ID: Tamara Proctor, female    DOB: [birth year], 47 y.o.   MRN: [id number]   CC: joint pains  Joint pain- unable to get voltaren or tramadol, she is still having diffuse joint pains. Having problems getting meds at pharmacy. She has been told her insurance wont cover medicine and they will not fill it. She reports being depressed over chronic pain and would like some medication started for depression. She is trying to get on disability due to pain interfering with her inability to work.  Additionally she complains of pain in her chest. She localizes this to the left 2nd and 3rd rib.  Smoking status reviewed- non-smoker  Review of Systems- endorses chest pain, anxiety, depression, palpitation. Denies SOB, nausea, diaphoresis. Denies SI/HI   Objective:  BP 116/74   Pulse 76   Temp 98.1 F (36.7 C) (Oral)   Ht 5\' 1"  (1.549 m)   Wt 183 lb (83 kg)   LMP 06/16/2017 (Approximate)   SpO2 99%   BMI 34.58 kg/m  Vitals and nursing note reviewed  General: well nourished, in no acute distress HEENT: normocephalic, MMM Neck: supple, non-tender, without lymphadenopathy Cardiac: RRR, clear S1 and S2, no murmurs, rubs, or gallops. Chest wall pain with palpation of upper left ribs and sternum.  Respiratory: clear to auscultation bilaterally, no increased work of breathing Extremities: no edema or cyanosis. No joint swelling or deformities noted. Skin: warm and dry, no rashes noted Neuro: alert and oriented, no focal deficits   Assessment & Plan:    1. Multiple joint pain Sent rx to new pharmacy as patient having difficulties at [place]. Encouraged use of voltaren gel topically to joints that are painful. Schedule tylenol every 6 hours. Reserve tramadol for severe pain. Filled out clinic portion of form for disability, will await record request from social services.  2. Other depression Rx for lexapro started. Follow up 1 month to assess control.  3. Chest  pain Consistent with MSK etiology. Reproducible on exam. No other associated symptoms concerning for cardiac etiology. Patient is in no distress with stable vital signs. Treat pain as above.    Return in about 1 month (around 07/30/2017) for mood .   Lucila Maine, DO Family Medicine Resident PGY-2

## 2017-07-02 NOTE — Patient Instructions (Addendum)
  It was good to see you today.  If you have difficulty getting your medications please call our office to let us know.   If you have questions or concerns please do not hesitate to call at [phone].  Lucila Maine, DO PGY-2, [facility] 07/02/2017 4:25 PM

## 2017-07-04 ENCOUNTER — Encounter: Payer: Self-pay | Admitting: Family Medicine

## 2017-08-05 ENCOUNTER — Ambulatory Visit: Payer: BLUE CROSS/BLUE SHIELD | Admitting: Internal Medicine

## 2017-08-05 ENCOUNTER — Other Ambulatory Visit: Payer: Self-pay

## 2017-08-05 ENCOUNTER — Encounter: Payer: Self-pay | Admitting: Internal Medicine

## 2017-08-05 VITALS — BP 126/80 | HR 67 | Temp 97.6°F | Ht 61.0 in | Wt 181.8 lb

## 2017-08-05 DIAGNOSIS — G8929 Other chronic pain: Secondary | ICD-10-CM

## 2017-08-05 DIAGNOSIS — M545 Low back pain: Secondary | ICD-10-CM | POA: Diagnosis not present

## 2017-08-05 DIAGNOSIS — K219 Gastro-esophageal reflux disease without esophagitis: Secondary | ICD-10-CM | POA: Diagnosis not present

## 2017-08-05 NOTE — Patient Instructions (Signed)
          .       .           .           .        .       .             .        Ms. Beem,  I am reassured by your normal strength and reflexes. I will refer you to physical therapy for back pain.   Please continue tylenol, voltaren, and tramadol as needed.  Take lexapro daily in order to see change in pain and mood. It may take up to a month to notice a difference.  Continue protonix and zantac for reflux. It can take a few months to notice an effect as your stomach lining heals.  Best, Dr. Ola Spurr

## 2017-08-07 ENCOUNTER — Encounter: Payer: Self-pay | Admitting: Internal Medicine

## 2017-08-07 DIAGNOSIS — M545 Low back pain: Secondary | ICD-10-CM

## 2017-08-07 DIAGNOSIS — G8929 Other chronic pain: Secondary | ICD-10-CM | POA: Insufficient documentation

## 2017-08-07 NOTE — Assessment & Plan Note (Addendum)
-   Ongoing. No red flag symptoms of objective weakness or bowel/bladder changes.  - Recommended PT now that patient has insurance. She is interested and agreeable to this. Placed referral. - Continue tylenol regularly with tramadol prn. She declines need for refills. - Counseled on need to take SSRI consistently and that may not notice any improvements for a few weeks. Suggested switching to another agent like duloxetine for her chronic back pain, as well. She prefers to try lexapro consistently first.

## 2017-08-07 NOTE — Progress Notes (Signed)
[name] Progress Note  Subjective:  Tamara Proctor is a 47 y.o. female with history of chronic back pain, anemia, and obesity who presents for back pain and stomach pains. Visit assisted by Arabic video interpreter [place] (ID [phone]).   #Back pain: - Worse for the last week. It hurts to stand for more than 2 minutes at a time. Mostly localized to mid to low back. - Concerned because pain is more intense and her legs can feel weak/shake. - Had MRI 03/2016 that showed DDD and lumbar spondylosis. History of left-sided sciatica.  - Denies change in urinary or bowel habits but reports chronic urinary urgency and frequency - Says was able to pick up medications since last visit (tylenol, voltaren, tramadol). Says they all help some but that she still hurts. - Has not been taking lexapro consistently. Says her mood is poor due to her pain rather than feeling like her mood contributes to her pain.  - Has not tried PT before due to insurance limitations. Not interested in back injections because fearful it could worsen pain.  ROS: No urinary/bowel incontinence; no falls  #Stomach pain: - Takes ranitidine and prilosec but only recently started last month; does help but still has some pain after eating - had negative testing for H. pylori last year but was POCT  No Known Allergies  Social History   Tobacco Use  . Smoking status: Never Smoker  . Smokeless tobacco: Never Used  Substance Use Topics  . Alcohol use: No    Objective: Blood pressure 126/80, pulse 67, temperature 97.6 F (36.4 C), temperature source Oral, height 5\' 1"  (1.549 m), weight 181 lb 12.8 oz (82.5 kg), last menstrual period 07/30/2017, SpO2 99 %. Body mass index is 34.35 kg/m. Constitutional: Obese female in NAD HENT: Normal posterior oropharynx.  Abdominal: Soft. +BS, mildly TTP over epigastric region Musculoskeletal: Inconsistently TTP over thoracic back (was not when distracted) but TTP  over paraspinal muscles of mid to low back.  Neurological: AOx3, no focal deficits. Patellar and Achilles reflexes symmetric. Strength of UEs and LEs 5/5 bilaterally.  Psychiatric: Flat affect.  Vitals reviewed  Assessment/Plan: GERD (gastroesophageal reflux disease) - Counseled patient to continue medication for a couple more months - If no improvement, would provide further dietary counseling and consider referral to GI for more definitive H. pylori testing  Chronic bilateral low back pain - Ongoing. No red flag symptoms of objective weakness or bowel/bladder changes.  - Recommended PT now that patient has insurance. She is interested and agreeable to this. Placed referral. - Continue tylenol regularly with tramadol prn. She declines need for refills. - Counseled on need to take SSRI consistently and that may not notice any improvements for a few weeks. Suggested switching to another agent like duloxetine for her chronic back pain, as well. She prefers to try lexapro consistently first.   Follow-up in about 4 weeks to reassess symptoms.  Olene Floss, MD [place], PGY-3

## 2017-08-07 NOTE — Assessment & Plan Note (Signed)
-   Counseled patient to continue medication for a couple more months - If no improvement, would provide further dietary counseling and consider referral to GI for more definitive H. pylori testing

## 2017-09-08 ENCOUNTER — Ambulatory Visit: Payer: BLUE CROSS/BLUE SHIELD | Admitting: Family Medicine

## 2017-09-08 ENCOUNTER — Other Ambulatory Visit: Payer: Self-pay

## 2017-09-08 ENCOUNTER — Encounter: Payer: Self-pay | Admitting: Family Medicine

## 2017-09-08 VITALS — BP 122/64 | HR 72 | Temp 98.3°F | Ht 61.0 in | Wt 183.0 lb

## 2017-09-08 DIAGNOSIS — M5412 Radiculopathy, cervical region: Secondary | ICD-10-CM

## 2017-09-08 DIAGNOSIS — M79642 Pain in left hand: Secondary | ICD-10-CM

## 2017-09-08 MED ORDER — TRAMADOL HCL 50 MG PO TABS: 50.0000 mg | ORAL_TABLET | Freq: Two times a day (BID) | ORAL | 0 refills | Status: AC | PRN

## 2017-09-08 NOTE — Patient Instructions (Addendum)
Please give the patient papers to apply for orange card   I'd like to see you back after your MRI and PT sessions.   If you have questions or concerns please do not hesitate to call at [phone].  Lucila Maine, DO PGY-2, [facility] 09/08/2017 3:33 PM    Spondylolysis Rehab Ask your health care provider which exercises are safe for you. Do exercises exactly as told by your health care provider and adjust them as directed. It is normal to feel mild stretching, pulling, tightness, or discomfort as you do these exercises, but you should stop right away if you feel sudden pain or your pain gets worse. Do not begin these exercises until told by your health care provider. Stretching and range of motion exercises These exercises warm up your muscles and joints and improve the movement and flexibility of your hips and your back. These exercises may also help to relieve pain, numbness, and tingling. Exercise A: Single knee to chest  1. Lie on your back on a firm surface with both legs straight. 2. Bend one of your knees. Use your hands to move your knee up toward your chest until you feel a gentle stretch in your lower back and buttock. ? Hold your leg in this position by holding onto the front of your knee. ? Keep your other leg as straight as possible. 3. Hold for __________ seconds. 4. Slowly return to the starting position. 5. Repeat this exercise with your other leg. Repeat __________ times. Complete this exercise __________ times a day. Exercise B: Hamstring stretch, supine  1. Lie on your back. 2. Hold both ends of a belt or towel as you loop it over the ball of one of your feet. The ball of your foot is on the walking surface, right under your toes. 3. Straighten your knee and slowly pull on the belt to raise your leg. ? Do not let your knee bend while you do this. ? Keep your other leg flat on the floor. ? Raise the leg until you feel a gentle stretch in the back  of your knee or thigh. 4. Hold for __________ seconds. 5. If told by your health care provider, repeat this exercise with your other leg. Repeat __________ times. Complete this exercise __________ times a day. Strengthening exercises These exercises build strength and endurance in your back. Endurance is the ability to use your muscles for a long time, even after they get tired. Exercise C: Pelvic tilt 1. Lie on your back on a firm bed or the floor. Bend your knees and keep your feet flat. 2. Tense your abdominal muscles. Tip your pelvis up toward the ceiling and flatten your lower back into the floor. ? To help with this exercise, you may place a small towel under your lower back and try to push your back into the towel. 3. Hold for __________ seconds. 4. Let your muscles relax completely before you repeat this exercise. Repeat __________ times. Complete this exercise __________ times a day. Exercise D: Abdominal crunch  1. Lie on your back on a firm surface. Bend your knees and keep your feet flat. Cross your arms over your chest. 2. Tuck your chin down toward your chest, without bending your neck. 3. Use your abdominal muscles to lift your upper body off of the ground, straight up into the air. ? Try to lift yourself until your shoulder blades are off the ground. You may need to work up to this. ?  Keep your lower back on the ground while you crunch upward. ? Do not hold your breath. 4. Slowly lower yourself down. Keep your abdominal muscles tense until you are back to the starting position. Repeat __________ times. Complete this exercise __________ times a day. Exercise E: Alternating arm and leg raises  1. Get on your hands and knees on a firm surface. If you are on a hard floor, you may want to use padding to cushion your knees, such as an exercise mat. 2. Line up your arms and legs. Your hands should be below your shoulders, and your knees should be below your hips. 3. Lift your left  leg behind you. At the same time, raise your right arm and straighten it in front of you. ? Do not lift your leg higher than your hip. ? Do not lift your arm higher than your shoulder. ? Keep your abdominal and back muscles tight. ? Keep your hips facing the ground. ? Do not arch your back. ? Keep your balance carefully, and do not hold your breath. 4. Hold for __________ seconds. 5. Slowly return to the starting position and repeat with your right leg and your left arm. Repeat __________ times. Complete this exercise __________ times a day. Posture and body mechanics Body mechanics refers to the movements and positions of your body while you do your daily activities. Posture is part of body mechanics. Good posture and healthy body mechanics can help to relieve stress in your body's tissues and joints. Good posture means that your spine is in its natural S-curve position (your spine is neutral), your shoulders are pulled back slightly, and your head is not tipped forward. The following are general guidelines for applying improved posture and body mechanics to your everyday activities. Standing   When standing, keep your spine neutral and your feet about hip-width apart. Keep a slight bend in your knees. Your ears, shoulders, and hips should line up with each other.  When you do a task in which you stand in one place for a long time, place one foot up on a stable object that is 2-4 inches (5-10 cm) high, such as a footstool. This helps keep your spine neutral. Sitting   When sitting, keep your spine neutral and keep your feet flat on the floor. Use a footrest, if necessary, and keep your thighs parallel to the floor. Avoid rounding your shoulders, and avoid tilting your head forward.  When working at a desk or a computer, keep your desk at a height where your hands are slightly lower than your elbows. Slide your chair under your desk so you are close enough to maintain good posture.  When  working at a computer, place your monitor at a height where you are looking straight ahead and you do not have to tilt your head forward or downward to look at the screen. Resting  When lying down and resting, avoid positions that are most painful for you.  If you have pain with activities such as sitting, bending, stooping, or squatting (flexion-based activities), lie in a position in which your body does not bend very much. For example, avoid curling up on your side with your arms and knees near your chest (fetal position).  If you have pain with activities such as standing for a long time or reaching with your arms (extension-based activities), lie with your spine in a neutral position and bend your knees slightly. Try the following positions: ? Lying on your side with  a pillow between your knees. ? Lying on your back with a pillow under your knees.  Lifting   When lifting objects, keep your feet at least shoulder-width apart and tighten your abdominal muscles.  Bend your knees and hips and keep your spine neutral. It is important to lift using the strength of your legs, not your back. Do not lock your knees straight out.  Always ask for help to lift heavy or awkward objects. This information is not intended to replace advice given to you by your health care provider. Make sure you discuss any questions you have with your health care provider. Document Released: 03/11/2005 Document Revised: 11/16/2015 Document Reviewed: 12/20/2014 Elsevier Interactive Patient Education  [employer].

## 2017-09-08 NOTE — Progress Notes (Signed)
    Subjective:    Patient ID: Tamara Proctor, female    DOB: 04-19-1970, 47 y.o.   MRN: [id number]   CC: left arm pain  Reports persistent left arm pain from her neck to left shoulder and down arm. Denies weakness in that arm. Endorses some numbness and tingling. Has limited ROM in left arm. Denies any trauma to area. She is frustrated by the progressive pain in this arm. She is losing insurance soon and does not know what to do. She is asking for strong pain medications. 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   Smoking status reviewed- non smoker  Review of Systems- see HPI   Objective:  BP 122/64   Pulse 72   Temp 98.3 F (36.8 C) (Oral)   Ht 5\' 1"  (1.549 m)   Wt 183 lb (83 kg)   SpO2 96%   BMI 34.58 kg/m  Vitals and nursing note reviewed  General: well nourished, in no acute distress HEENT: normocephalic, MMM Neck: supple. Tender to palpation of paraspinal musculature L>R. Decreased ROM due to pain. No nuchal rigidity. No LAD Cardiac: RRR, clear S1 and S2, no murmurs, rubs, or gallops Respiratory: clear to auscultation bilaterally, no increased work of breathing Extremities: no edema or cyanosis.  MSK: Limited ROM of left shoulder in internal and external rotation, flexion, abduction secondary to pain. Tender to palpation over left AC joint.  Skin: warm and dry, no rashes noted Neuro: alert and oriented, no focal deficits   Assessment & Plan:   1. Left hand pain Discussed need to assess underlying issue. Patient has been resistant to imaging in past as it "has never been helpful". I discussed known arthritis in her lumbar spine and likelihood there is arthritis in her neck as well causing problems. It seems that there is some nerve involvement as well given her extreme pain and numbness/tingling in upper arm. She is agreeable to me ordering imaging but states she may not get it done due to losing insurance soon. I also went over ROM exercises with her and referred her  to PT for rehab, which she is resistant to. Discussed heat for pain relief and tylenol. Will give more tramadol for severe pain.  - DG Cervical Spine Complete; Future - Ambulatory referral to Physical Therapy - MR Cervical Spine Wo Contrast; Future  2. Left cervical radiculopathy See above. Concern for for nerve involvement. Doubt patient will get this MRI but she was open to me trying to order it for her. I explained at length that this is how we will see what the base problem is to figure out how to help and that covering pain with medications is not the solution.  - MR Cervical Spine Wo Contrast; Future   Return as needed.   Lucila Maine, DO Family Medicine Resident PGY-2

## 2017-09-13 ENCOUNTER — Other Ambulatory Visit: Payer: BLUE CROSS/BLUE SHIELD

## 2017-09-25 IMAGING — MG MM DIGITAL SCREENING BILAT
4 series · 4 of 4 positions shown · non-contrast
Comparison: None.

CLINICAL DATA: Screening.

EXAM:
DIGITAL SCREENING BILATERAL MAMMOGRAM WITH CAD

[R CC]
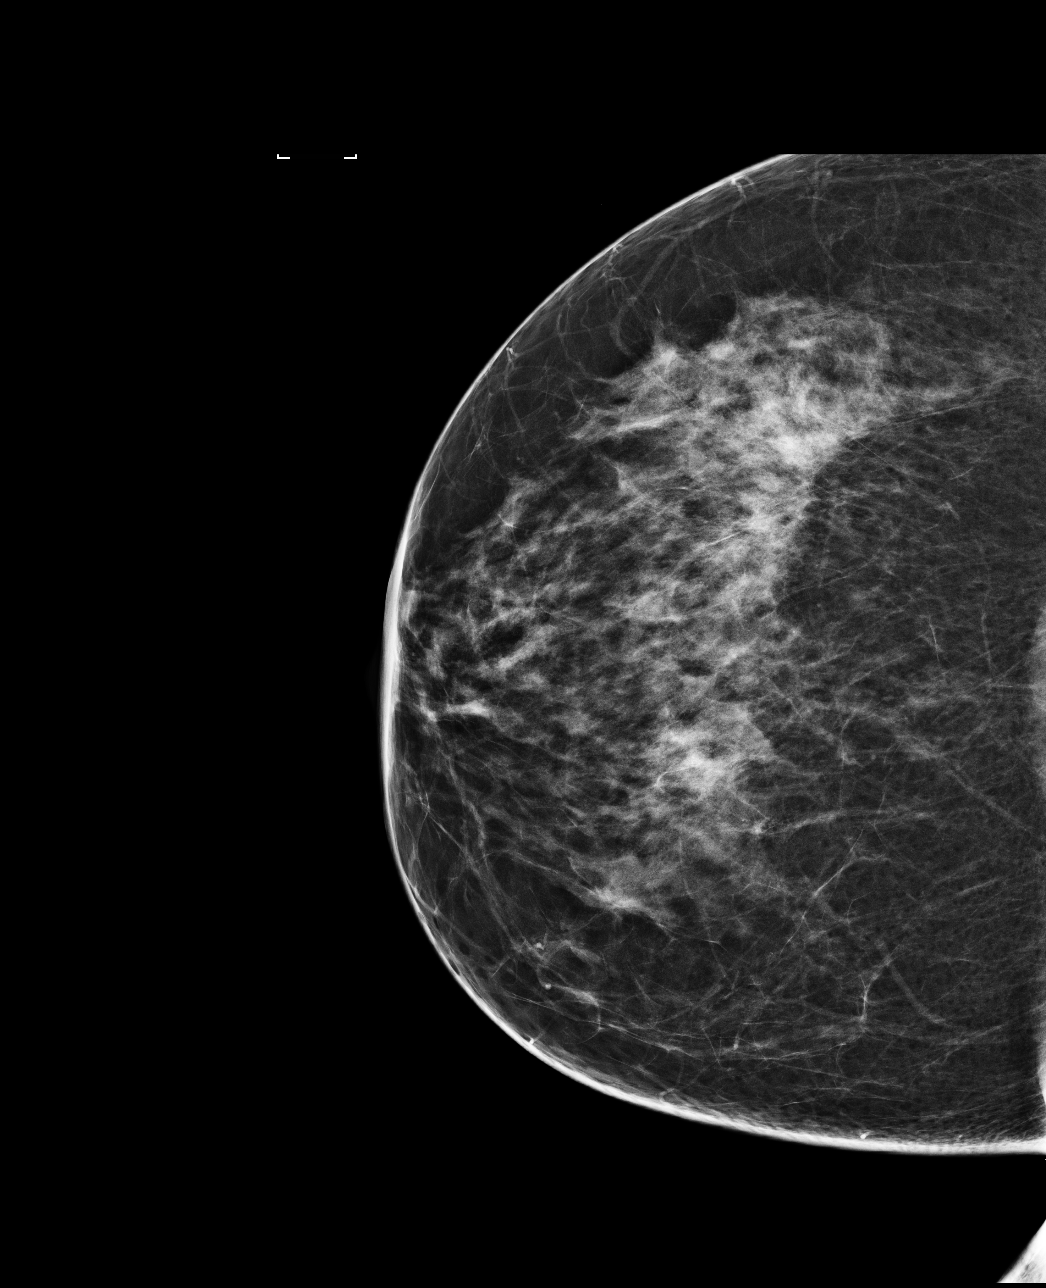

[L MLO]
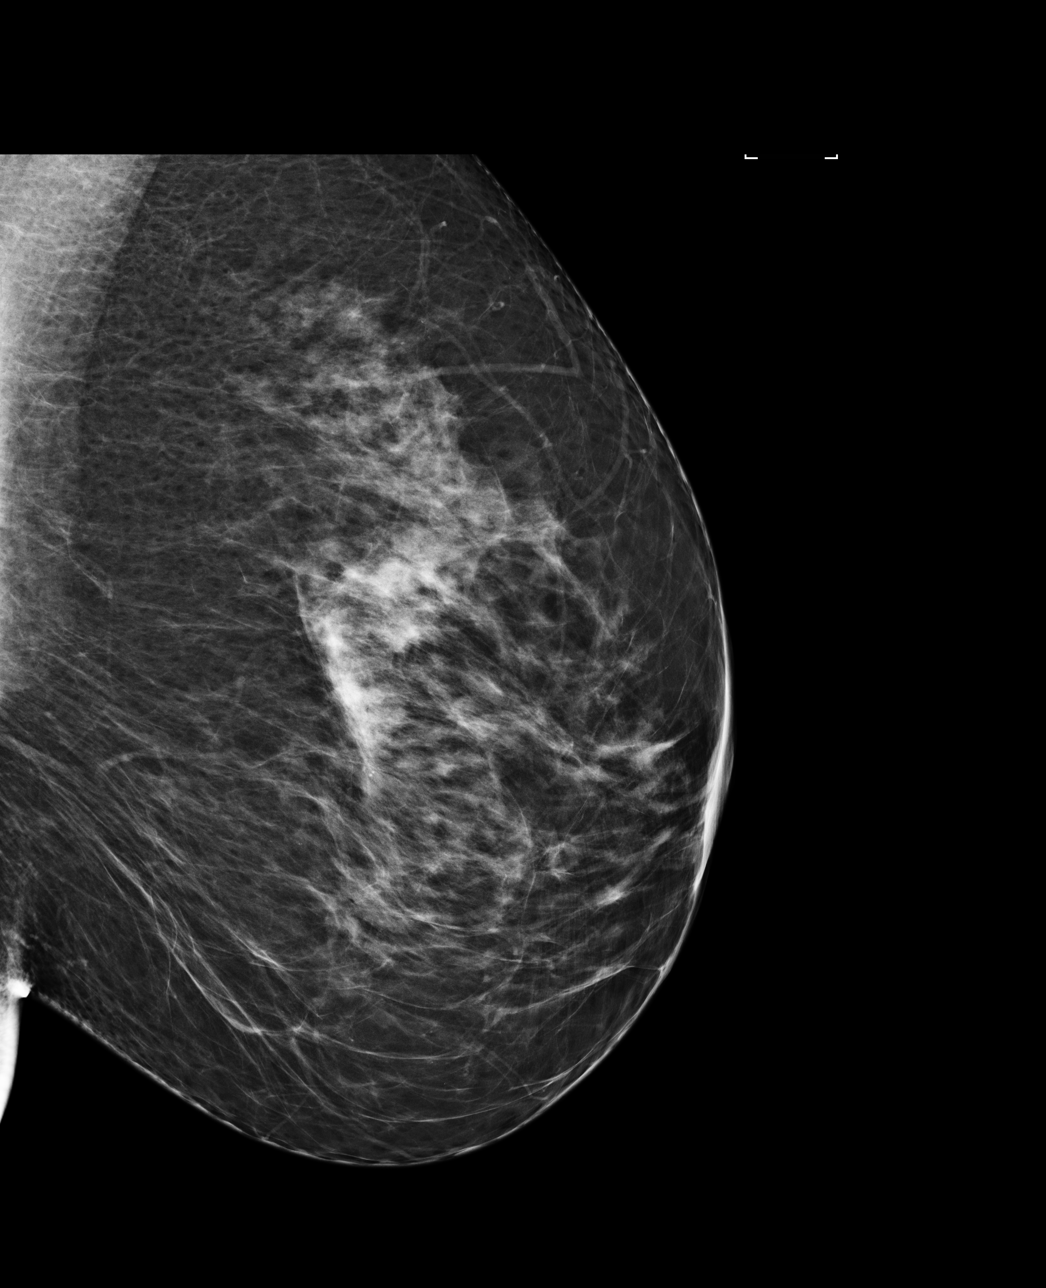

[L CC]
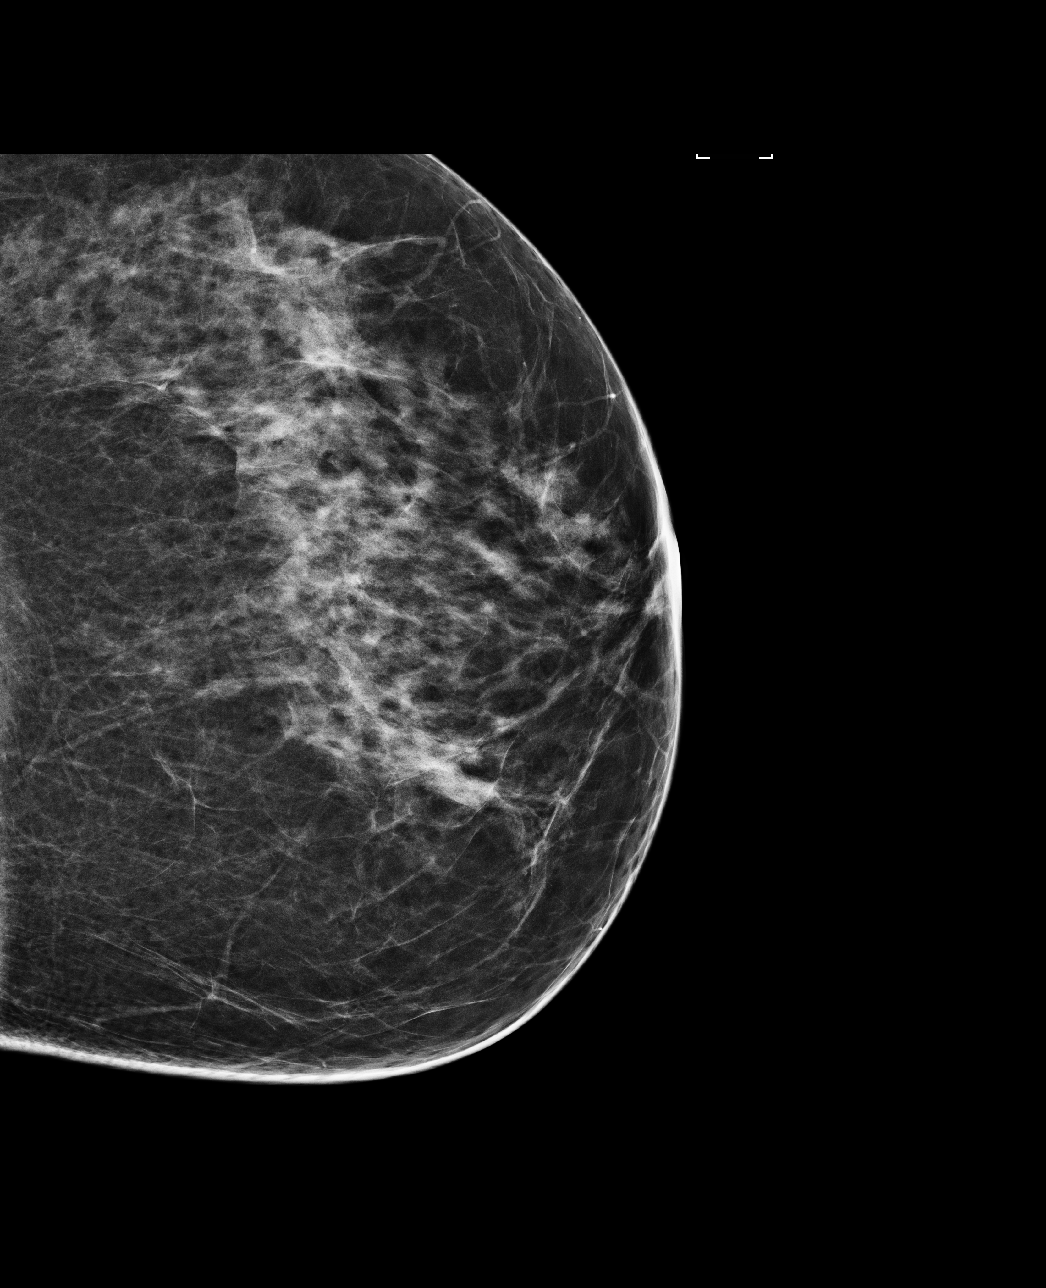

[R MLO]
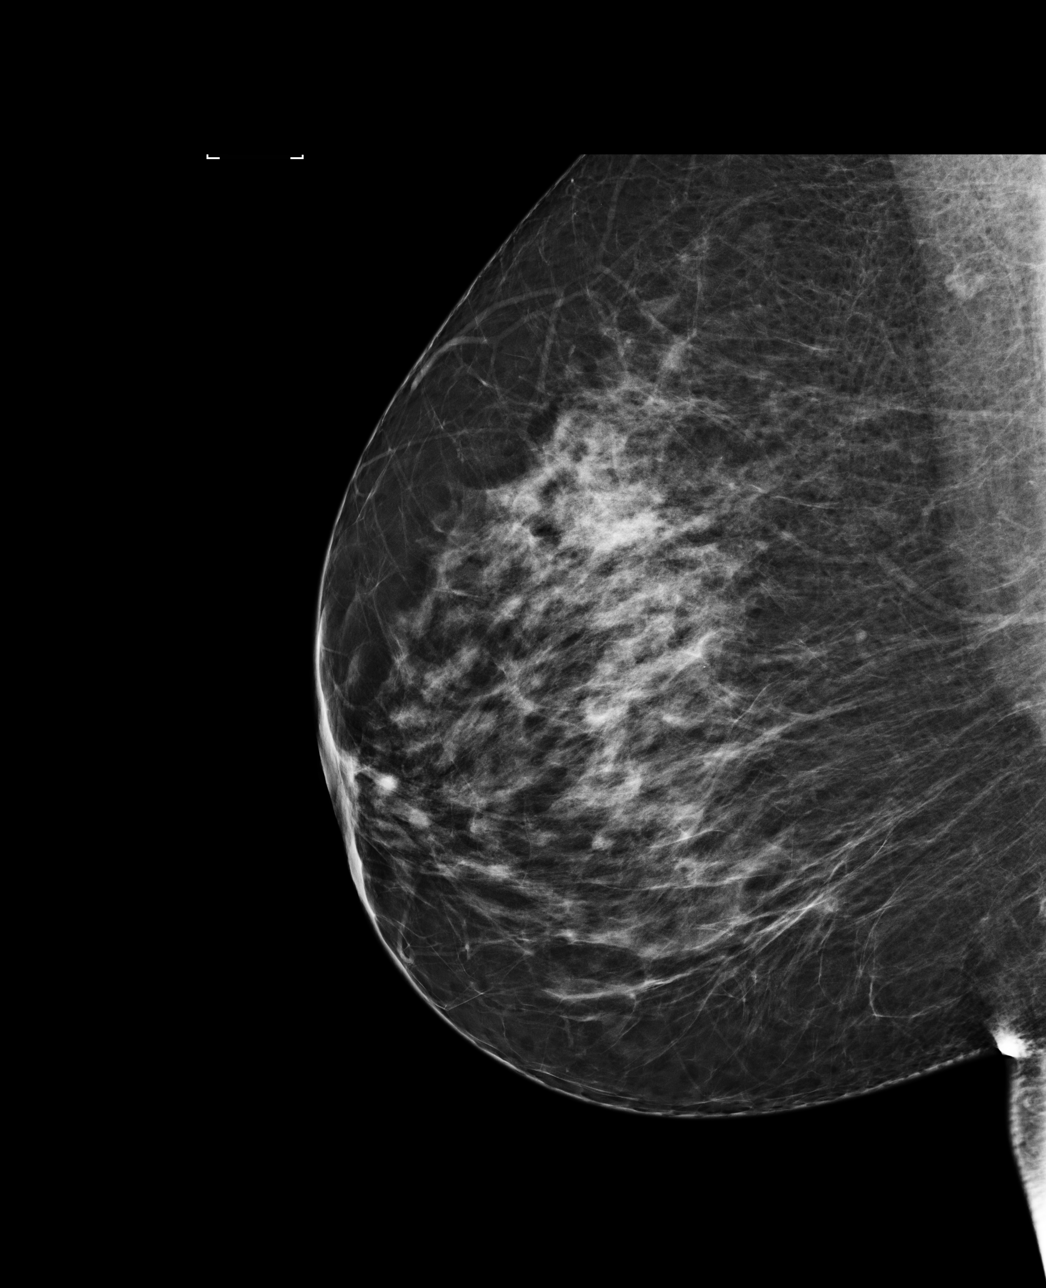

[4 of 4 positions shown; findings below may reference images not displayed]

ACR Breast Density Category c: The breast tissue is heterogeneously
dense, which may obscure small masses
FINDINGS: There are no findings suspicious for malignancy. Images were
processed with CAD.
IMPRESSION: No mammographic evidence of malignancy. A result letter of this
screening mammogram will be mailed directly to the patient.

RECOMMENDATION:
Screening mammogram in one year. (Code:U2-0-761)

BI-RADS CATEGORY  1: Negative.

## 2017-10-02 ENCOUNTER — Other Ambulatory Visit: Payer: Self-pay | Admitting: Family Medicine
